# Patient Record
Sex: Male | Born: 1995 | Race: White | Hispanic: No | Marital: Single | Smoking: Never smoker
Health system: Southern US, Community
[De-identification: ages and names within clinical notes are randomized; demographics above are authoritative.]

## PROBLEM LIST (undated history)

## (undated) DIAGNOSIS — D649 Anemia, unspecified: Secondary | ICD-10-CM

## (undated) DIAGNOSIS — N200 Calculus of kidney: Secondary | ICD-10-CM

## (undated) DIAGNOSIS — L309 Dermatitis, unspecified: Secondary | ICD-10-CM

## (undated) DIAGNOSIS — F32A Depression, unspecified: Secondary | ICD-10-CM

## (undated) DIAGNOSIS — F4010 Social phobia, unspecified: Secondary | ICD-10-CM

## (undated) DIAGNOSIS — T7840XA Allergy, unspecified, initial encounter: Secondary | ICD-10-CM

## (undated) DIAGNOSIS — F419 Anxiety disorder, unspecified: Secondary | ICD-10-CM

## (undated) DIAGNOSIS — E739 Lactose intolerance, unspecified: Secondary | ICD-10-CM

## (undated) DIAGNOSIS — F909 Attention-deficit hyperactivity disorder, unspecified type: Secondary | ICD-10-CM

## (undated) DIAGNOSIS — F329 Major depressive disorder, single episode, unspecified: Secondary | ICD-10-CM

## (undated) HISTORY — DX: Allergy, unspecified, initial encounter: T78.40XA

---
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
## 2018-04-30 ENCOUNTER — Other Ambulatory Visit: Payer: Self-pay

## 2018-04-30 ENCOUNTER — Emergency Department (HOSPITAL_COMMUNITY)
Admission: EM | Admit: 2018-04-30 | Discharge: 2018-04-30 | Disposition: A | Discharge status: Home / Self Care | Payer: Self-pay | Attending: Emergency Medicine | Admitting: Emergency Medicine

## 2018-04-30 ENCOUNTER — Emergency Department (HOSPITAL_COMMUNITY): Payer: Self-pay

## 2018-04-30 ENCOUNTER — Encounter (HOSPITAL_COMMUNITY): Payer: Self-pay

## 2018-04-30 DIAGNOSIS — Z79899 Other long term (current) drug therapy: Secondary | ICD-10-CM | POA: Insufficient documentation

## 2018-04-30 DIAGNOSIS — B349 Viral infection, unspecified: Secondary | ICD-10-CM | POA: Insufficient documentation

## 2018-04-30 HISTORY — DX: Social phobia, unspecified: F40.10

## 2018-04-30 HISTORY — DX: Depression, unspecified: F32.A

## 2018-04-30 HISTORY — DX: Attention-deficit hyperactivity disorder, unspecified type: F90.9

## 2018-04-30 HISTORY — DX: Major depressive disorder, single episode, unspecified: F32.9

## 2018-04-30 HISTORY — DX: Dermatitis, unspecified: L30.9

## 2018-04-30 HISTORY — DX: Anemia, unspecified: D64.9

## 2018-04-30 LAB — CBC
HCT: 50.3 % (ref 39.0–52.0)
Hemoglobin: 17.2 g/dL — ABNORMAL HIGH (ref 13.0–17.0)
MCH: 30.6 pg (ref 26.0–34.0)
MCHC: 34.2 g/dL (ref 30.0–36.0)
MCV: 89.3 fL (ref 78.0–100.0)
PLATELETS: 222 10*3/uL (ref 150–400)
RBC: 5.63 MIL/uL (ref 4.22–5.81)
RDW: 13.7 % (ref 11.5–15.5)
WBC: 7.8 10*3/uL (ref 4.0–10.5)

## 2018-04-30 LAB — GROUP A STREP BY PCR: Group A Strep by PCR: NOT DETECTED

## 2018-04-30 LAB — COMPREHENSIVE METABOLIC PANEL
ALK PHOS: 69 U/L (ref 38–126)
ALT: 30 U/L (ref 17–63)
AST: 32 U/L (ref 15–41)
Albumin: 4.9 g/dL (ref 3.5–5.0)
Anion gap: 15 (ref 5–15)
BUN: 8 mg/dL (ref 6–20)
CALCIUM: 9.6 mg/dL (ref 8.9–10.3)
CHLORIDE: 100 mmol/L — AB (ref 101–111)
CO2: 27 mmol/L (ref 22–32)
CREATININE: 0.95 mg/dL (ref 0.61–1.24)
GFR calc Af Amer: >60 mL/min (ref >60)
GFR calc non Af Amer: >60 mL/min (ref >60)
Glucose, Bld: 87 mg/dL (ref 65–99)
Potassium: 3.8 mmol/L (ref 3.5–5.1)
Sodium: 142 mmol/L (ref 135–145)
Total Bilirubin: 0.6 mg/dL (ref 0.3–1.2)
Total Protein: 9 g/dL — ABNORMAL HIGH (ref 6.5–8.1)

## 2018-04-30 LAB — URINALYSIS, ROUTINE W REFLEX MICROSCOPIC
Bilirubin Urine: NEGATIVE
Glucose, UA: NEGATIVE mg/dL
HGB URINE DIPSTICK: NEGATIVE
KETONES UR: 20 mg/dL — AB
Leukocytes, UA: NEGATIVE
Nitrite: NEGATIVE
PROTEIN: NEGATIVE mg/dL
Specific Gravity, Urine: 1.02 (ref 1.005–1.030)
pH: 5 (ref 5.0–8.0)

## 2018-04-30 LAB — MONONUCLEOSIS SCREEN: Mono Screen: NEGATIVE

## 2018-04-30 LAB — I-STAT CG4 LACTIC ACID, ED: Lactic Acid, Venous: 1.95 mmol/L — ABNORMAL HIGH (ref 0.5–1.9)

## 2018-04-30 LAB — INFLUENZA PANEL BY PCR (TYPE A & B)
INFLAPCR: NEGATIVE
Influenza B By PCR: NEGATIVE

## 2018-04-30 LAB — LIPASE, BLOOD: LIPASE: 23 U/L (ref 11–51)

## 2018-04-30 MED ORDER — ONDANSETRON HCL 4 MG/2ML IJ SOLN
4.0000 mg | Freq: Once | INTRAMUSCULAR | Status: AC
  Administered 2018-04-30: 4 mg via INTRAVENOUS
  Filled 2018-04-30: qty 2

## 2018-04-30 MED ORDER — SODIUM CHLORIDE 0.9 % IV BOLUS
1000.0000 mL | Freq: Once | INTRAVENOUS | Status: AC
  Administered 2018-04-30: 1000 mL via INTRAVENOUS

## 2018-04-30 MED ORDER — ONDANSETRON HCL 4 MG PO TABS: 4.0000 mg | ORAL_TABLET | Freq: Three times a day (TID) | ORAL | 0 refills | Status: DC | PRN

## 2018-04-30 MED ORDER — IBUPROFEN 800 MG PO TABS
800.0000 mg | ORAL_TABLET | Freq: Once | ORAL | Status: AC
  Administered 2018-04-30: 800 mg via ORAL
  Filled 2018-04-30: qty 1

## 2018-04-30 MED ORDER — LORAZEPAM 2 MG/ML IJ SOLN
0.5000 mg | Freq: Once | INTRAMUSCULAR | Status: AC
  Administered 2018-04-30: 0.5 mg via INTRAVENOUS
  Filled 2018-04-30: qty 1

## 2018-04-30 MED ORDER — DOXYCYCLINE HYCLATE 100 MG PO CAPS: 100.0000 mg | ORAL_CAPSULE | Freq: Two times a day (BID) | ORAL | 0 refills | Status: DC

## 2018-04-30 NOTE — Discharge Instructions (Addendum)
you were seen in the emergency department and you have a nonspecific viral illness.  I am treating you with doxycycline which covers for tickborne illnesses.  [facility] ending.  You have a negative mono and negative strep test today.  Please follow-up with your primary care physician as soon as possible.  Contact a health care provider if: Your symptoms last for 10 days or longer. Your symptoms get worse over time. You have a fever. You have severe sinus pain in your face or forehead. The glands in your jaw or neck become very swollen. Get help right away if: You feel pain or pressure in your chest. You have shortness of breath. You faint or feel like you will faint. You have severe and persistent vomiting. You feel confused or disoriented.

## 2018-04-30 NOTE — ED Notes (Signed)
Bed: WA02 Expected date:  Expected time:  Means of arrival:  Comments: 

## 2018-04-30 NOTE — ED Provider Notes (Signed)
[facility]-EMERGENCY DEPT Provider Note   CSN: [id number] Arrival date & time: 04/30/18  0947     History   Chief Complaint Chief Complaint  Patient presents with  . Fever  . Back Pain  . Emesis    HPI Norm SaltJason Petties is a 22 y.o. male who presents the emergency department with flulike symptoms.  Patient states that he had onset of low back pain 4 days ago with followed by development of sore throat, fevers, cough and vomiting.  He denies rashes, stiff neck.  He does have a headache but denies photophobia, phonophobia or rash.  He has no recent foreign travel, contacts with similar symptoms or known tick bites.  She has had about 4-5 episodes of vomiting today.  He denies any diarrhea, abdominal pain.  HPI  Past Medical History:  Diagnosis Date  . ADHD   . Allergy   . Anemia   . Depression   . Eczema   . Social anxiety disorder     Patient Active Problem List   Diagnosis Date Noted  . Anemia 02/18/2012  . ADHD (attention deficit hyperactivity disorder), combined type 02/18/2012    History reviewed. No pertinent surgical history.      Home Medications    Prior to Admission medications   Medication Sig Start Date End Date Taking? Authorizing Provider  acetaminophen (TYLENOL) 325 MG tablet Take 650 mg by mouth every 6 (six) hours as needed for moderate pain (back pain).   Yes [provider]  ferrous sulfate (SLOW IRON) 160 (50 FE) MG TBCR SR tablet Take 160 mg by mouth daily.    Yes [provider]  fexofenadine (ALLEGRA) 180 MG tablet Take 180 mg by mouth daily.   Yes [provider]  guaiFENesin (MUCUS RELIEF CHEST CONGESTION) 200 MG tablet Take 200 mg by mouth every 4 (four) hours as needed for cough or to loosen phlegm.   Yes [provider]  ibuprofen (ADVIL,MOTRIN) 200 MG tablet Take 200 mg by mouth every 6 (six) hours as needed for fever or moderate pain.   Yes [provider]    amphetamine-dextroamphetamine (ADDERALL XR) 10 MG 24 hr capsule Take 1 capsule (10 mg total) by mouth daily at 12 noon. 05/19/12 06/18/12  Arfeen, Phillips GroutSyed T, MD  amphetamine-dextroamphetamine (ADDERALL) 10 MG tablet Take 1 tablet (10 mg total) by mouth every morning. 05/19/12 06/18/12  Arfeen, Phillips GroutSyed T, MD  gabapentin (NEURONTIN) 600 MG tablet TAKE 1 TABLET (600 MG TOTAL) BY MOUTH 2 (TWO) TIMES DAILY. Patient not taking: Reported on 04/30/2018 05/05/16   Elvina SidleLauenstein, Kurt, MD  QUEtiapine (SEROQUEL) 100 MG tablet TAKE 1/2 A TABLET BY MOUTH AT BEDTIME Patient not taking: Reported on 04/30/2018 01/15/16   [place] BambergMani, Mario, PA-C  QUEtiapine (SEROQUEL) 100 MG tablet TAKE 1/2 A TABLET BY MOUTH AT BEDTIME Patient not taking: Reported on 04/30/2018 04/16/16   Elvina SidleLauenstein, Kurt, MD    Family History Family History  Problem Relation Age of Onset  . Bipolar disorder Father     Social History Social History   Tobacco Use  . Smoking status: Never Smoker  . Smokeless tobacco: Never Used  Substance Use Topics  . Alcohol use: Never    Alcohol/week: 0.0 oz    Frequency: Never  . Drug use: Never     Allergies   Other   Review of Systems Review of Systems  Ten systems reviewed and are negative for acute change, except as noted in the HPI.   Physical  Exam Updated Vital Signs BP 116/73   Pulse (!) 109   Temp 99.3 F (37.4 C) (Oral)   Resp 15   Ht 5\' 4"  (1.626 m)   Wt 87.5 kg (193 lb)   SpO2 98%   BMI 33.13 kg/m   Physical Exam  Constitutional: He appears well-developed and well-nourished. He appears ill. No distress.  HENT:  Head: Normocephalic and atraumatic.  Eyes: Conjunctivae are normal. No scleral icterus.  Neck: Normal range of motion. Neck supple.  Tonsillar adenopathy.  Full range of motion of the neck without meningismus or nuchal rigidity.  Cardiovascular: Normal rate, regular rhythm and normal heart sounds.  Pulmonary/Chest: Effort normal and breath sounds normal. No respiratory distress.   Abdominal: Soft. There is no tenderness.  Musculoskeletal: He exhibits no edema.  Lymphadenopathy:    He has cervical adenopathy.  Neurological: He is alert.  Skin: Skin is warm and dry. He is not diaphoretic.  Psychiatric: His behavior is normal.  Nursing note and vitals reviewed.    ED Treatments / Results  Labs (all labs ordered are listed, but only abnormal results are displayed) Labs Reviewed  COMPREHENSIVE METABOLIC PANEL - Abnormal; Notable for the following components:      Result Value   Chloride 100 (*)    Total Protein 9.0 (*)    All other components within normal limits  CBC - Abnormal; Notable for the following components:   Hemoglobin 17.2 (*)    All other components within normal limits  I-STAT CG4 LACTIC ACID, ED - Abnormal; Notable for the following components:   Lactic Acid, Venous 1.95 (*)    All other components within normal limits  GROUP A STREP BY PCR  LIPASE, BLOOD  MONONUCLEOSIS SCREEN  URINALYSIS, ROUTINE W REFLEX MICROSCOPIC  ROCKY MTN SPOTTED FVR ABS PNL(IGG+IGM)  INFLUENZA PANEL BY PCR (TYPE A & B)    EKG None  Radiology Dg Chest 2 View  Result Date: 04/30/2018 CLINICAL DATA:  Cough and fever. EXAM: CHEST - 2 VIEW COMPARISON:  PA and lateral chest 02/18/2013. FINDINGS: The lungs are clear. Heart size is normal. No pneumothorax or pleural fluid. No focal bony abnormality. IMPRESSION: Negative chest. Electronically Signed   By: Drusilla Kanner M.D.   On: 04/30/2018 13:53    Procedures Procedures (including critical care time)  Medications Ordered in ED Medications  sodium chloride 0.9 % bolus 1,000 mL (1,000 mLs Intravenous New Bag/Given 04/30/18 1302)  ibuprofen (ADVIL,MOTRIN) tablet 800 mg (800 mg Oral Given 04/30/18 1302)  ondansetron (ZOFRAN) injection 4 mg (4 mg Intravenous Given 04/30/18 1302)  LORazepam (ATIVAN) injection 0.5 mg (0.5 mg Intravenous Given 04/30/18 1303)     Initial Impression / Assessment and Plan / ED Course  I  have reviewed the triage vital signs and the nursing notes.  Pertinent labs & imaging results that were available during my care of the patient were reviewed by me and considered in my medical decision making (see chart for details).  Clinical Course as of Apr 30 1646  Fri Apr 30, 2018  1417 Group A Strep by PCR: NOT DETECTED [AH]  1417 Chloride is likely low from vomiting  Chloride(!): 100 [AH]  1417 Elevated lactic acid secondary to dehydration  Lactic Acid, Venous(!): 1.95 [AH]  1417 Hemoglobin elevated again suggestive of dehydration  Hemoglobin(!): 17.2 [AH]  1418 Elevated proteinemia noted  Total Protein(!): 9.0 [AH]  1418 Mono Screen: NEGATIVE [AH]    Clinical Course User Index [AH] Arthor Captain, PA-C  Patient with nonspecific viral illness.  His viral titers are still pending and I discharge the patient with doxycycline.  The patient will be discharged home.  I discussed return precautions.  I have no suspicion for meningitis as he has excellent range of motion with his neck and his fever has resolved he has no headache.  Chest x-ray is negative however doxycycline should cover for any radiologic atypical pneumonia if present.  Patient appears appropriate for discharge at this time.  Final Clinical Impressions(s) / ED Diagnoses   Final diagnoses:  Nonspecific syndrome suggestive of viral illness    ED Discharge Orders    None       Arthor Captain, PA-C 04/30/18 1648    Donnetta Hutching, MD 05/01/18 [phone]

## 2018-04-30 NOTE — ED Triage Notes (Signed)
Patient c/o mid lower back pain x 6 days and fever x 3 days. Patient states he has been taking Ibuprofen 600 mg every 3-33.5 hours for temperature x 3 days. Patient also reports vomiting since yesterday.

## 2018-05-02 LAB — ROCKY MTN SPOTTED FVR ABS PNL(IGG+IGM)
RMSF IGG: NEGATIVE
RMSF IgM: 0.3 index (ref 0.00–0.89)

## 2018-10-23 ENCOUNTER — Encounter (HOSPITAL_COMMUNITY): Payer: Self-pay | Admitting: Emergency Medicine

## 2018-10-23 ENCOUNTER — Ambulatory Visit (HOSPITAL_COMMUNITY)
Admission: EM | Admit: 2018-10-23 | Discharge: 2018-10-23 | Disposition: A | Discharge status: Home / Self Care | Payer: Self-pay | Attending: Internal Medicine | Admitting: Internal Medicine

## 2018-10-23 ENCOUNTER — Ambulatory Visit (INDEPENDENT_AMBULATORY_CARE_PROVIDER_SITE_OTHER): Payer: Self-pay

## 2018-10-23 DIAGNOSIS — N200 Calculus of kidney: Secondary | ICD-10-CM

## 2018-10-23 DIAGNOSIS — R109 Unspecified abdominal pain: Secondary | ICD-10-CM | POA: Insufficient documentation

## 2018-10-23 LAB — POCT URINALYSIS DIP (DEVICE)
Bilirubin Urine: NEGATIVE
GLUCOSE, UA: NEGATIVE mg/dL
Ketones, ur: NEGATIVE mg/dL
LEUKOCYTES UA: NEGATIVE
NITRITE: NEGATIVE
Protein, ur: NEGATIVE mg/dL
Specific Gravity, Urine: 1.02 (ref 1.005–1.030)
Urobilinogen, UA: 0.2 mg/dL (ref 0.0–1.0)
pH: 7 (ref 5.0–8.0)

## 2018-10-23 MED ORDER — TAMSULOSIN HCL 0.4 MG PO CAPS: ORAL_CAPSULE | ORAL | 0 refills | Status: DC

## 2018-10-23 MED ORDER — KETOROLAC TROMETHAMINE 60 MG/2ML IM SOLN
60.0000 mg | Freq: Once | INTRAMUSCULAR | Status: AC
  Administered 2018-10-23: 60 mg via INTRAMUSCULAR

## 2018-10-23 MED ORDER — TRAMADOL HCL 50 MG PO TABS: ORAL_TABLET | ORAL | 0 refills | Status: DC

## 2018-10-23 MED ORDER — KETOROLAC TROMETHAMINE 60 MG/2ML IM SOLN
INTRAMUSCULAR | Status: AC
  Filled 2018-10-23: qty 2

## 2018-10-23 NOTE — ED Provider Notes (Addendum)
[facility]    CSN: [id number] Arrival date & time: 10/23/18  1410     History   Chief Complaint Chief Complaint  Patient presents with  . Flank Pain    HPI Tanner Sharp is a 22 y.o. male.   Who presents with onset of R flank pain x 1 weeks, since the 2nd day has been getting worse. Pain comes in waves. On day 3 felt mild discomfort to void. Has had frequency. Has been getting sweats with severe pain when his dog who sleeps with him and pushes himself to get off the bed. States that his mother has hx of renal stones.     Past Medical History:  Diagnosis Date  . ADHD   . Allergy   . Anemia   . Depression   . Eczema   . Social anxiety disorder     Patient Active Problem List   Diagnosis Date Noted  . Anemia 02/18/2012  . ADHD (attention deficit hyperactivity disorder), combined type 02/18/2012    History reviewed. No pertinent surgical history.   Home Medications    Prior to Admission medications   Medication Sig Start Date End Date Taking? Authorizing Provider  acetaminophen (TYLENOL) 325 MG tablet Take 650 mg by mouth every 6 (six) hours as needed for moderate pain (back pain).    [provider]  ferrous sulfate (SLOW IRON) 160 (50 FE) MG TBCR SR tablet Take 160 mg by mouth daily.     [provider]  fexofenadine (ALLEGRA) 180 MG tablet Take 180 mg by mouth daily.    [provider]  guaiFENesin (MUCUS RELIEF CHEST CONGESTION) 200 MG tablet Take 200 mg by mouth every 4 (four) hours as needed for cough or to loosen phlegm.    [provider]  ibuprofen (ADVIL,MOTRIN) 200 MG tablet Take 200 mg by mouth every 6 (six) hours as needed for fever or moderate pain.    [provider]  ondansetron (ZOFRAN) 4 MG tablet Take 1 tablet (4 mg total) by mouth every 8 (eight) hours as needed for nausea or vomiting. 04/30/18   Arthor CaptainHarris, Abigail, PA-C  tamsulosin [facility](FLOMAX) 0.4 MG CAPS capsule One qd to help pass stone 10/23/18    Rodriguez-Southworth, Nettie ElmSylvia, PA-C  traMADol (ULTRAM) 50 MG tablet 1-2 q 6h prn pain 10/23/18   Rodriguez-Southworth, Nettie ElmSylvia, PA-C    Family History Family History  Problem Relation Age of Onset  . Bipolar disorder Father     Social History Social History   Tobacco Use  . Smoking status: Never Smoker  . Smokeless tobacco: Never Used  Substance Use Topics  . Alcohol use: Never    Alcohol/week: 0.0 standard drinks    Frequency: Never  . Drug use: Never     Allergies   Other   Review of Systems Review of Systems  Constitutional: Negative for chills, diaphoresis and fever.  Gastrointestinal: Negative for abdominal distention, abdominal pain, nausea and vomiting.  Genitourinary: Negative for difficulty urinating, dysuria, frequency, hematuria and urgency.  Musculoskeletal: Positive for back pain.  Skin: Negative for rash.  Neurological: Negative for numbness.     Physical Exam Triage Vital Signs ED Triage Vitals [10/23/18 1608]  Enc Vitals Group     BP 121/71     Pulse Rate 67     Resp 16     Temp 98.2 F (36.8 C)     Temp src      SpO2 100 %     Weight  Height      Head Circumference      Peak Flow      Pain Score 8     Pain Loc      Pain Edu?      Excl. in GC?    No data found.  Updated Vital Signs BP 121/71   Pulse 67   Temp 98.2 F (36.8 C)   Resp 16   SpO2 100%   Visual Acuity Right Eye Distance:   Left Eye Distance:   Bilateral Distance:    Right Eye Near:   Left Eye Near:    Bilateral Near:     Physical Exam  Constitutional: He is oriented to person, place, and time. He appears well-developed and well-nourished. He appears distressed.  In moderate pain off and on  HENT:  Head: Normocephalic.  Right Ear: External ear normal.  Left Ear: External ear normal.  Nose: Nose normal.  Eyes: Conjunctivae are normal. No scleral icterus.  Neck: Neck supple.  Pulmonary/Chest: Effort normal.  Abdominal: Soft. Bowel sounds are normal. He  exhibits no distension and no mass. There is tenderness. There is no rebound and no guarding.  Has moderate tenderness on R mid abd only. No CVA tenderness.   Musculoskeletal: Normal range of motion.  BACK-Has mild  Pain( 2/5) on R flank with light touch and this pain is provoked with R lateral spine motion. No rash on this are. Pain with deep palpation on same area is 5/10. Neg SLR  Neurological: He is alert and oriented to person, place, and time. He displays normal reflexes.  Skin: Skin is warm and dry. He is not diaphoretic.  Psychiatric: He has a normal mood and affect. His behavior is normal. Judgment and thought content normal.  Nursing note and vitals reviewed.  UC Treatments / Results  Labs (all labs ordered are listed, but only abnormal results are displayed) Labs Reviewed  POCT URINALYSIS DIP (DEVICE) - Abnormal; Notable for the following components:      Result Value   Hgb urine dipstick TRACE (*)    All other components within normal limits  URINE CULTURE    EKG None  Radiology Dg Abd 1 View  Result Date: 10/23/2018 CLINICAL DATA:  Right flank pain for 5 days.  Hematuria. EXAM: ABDOMEN - 1 VIEW COMPARISON:  None. FINDINGS: The bowel gas pattern is normal. A 4 mm radiodensity is seen in the right upper quadrant, which is suspicious for calculus in the region of the right renal pelvis or UPJ. 2 mm radiodensity overlies the upper pole of the left kidney, suspicious for a tiny left renal calculus. IMPRESSION: 4 mm radiodensity in region of right renal pelvis or UPJ, suspicious for urinary calculus. Suspect tiny left upper pole renal calculus. Electronically Signed   By: Myles Rosenthal M.D.   On: 10/23/2018 17:09   Medications Ordered in UC Medications  ketorolac (TORADOL) injection 60 mg (60 mg Intramuscular Given 10/23/18 1736)    Initial Impression / Assessment and Plan / UC Course  I have reviewed the triage vital signs and the nursing notes.  Pertinent labs & imaging  results that were available during my care of the patient were reviewed by me and considered in my medical decision making (see chart for details). I placed him on Flomax 0. 4 qd, Tramadol, and given a strainer to catch if he passes a stone. If he gets worse, needs to go to ER Otherwise needs to FU with PCP next week.  Final Clinical Impressions(s) / UC Diagnoses   Final diagnoses:  Nephrolithiasis     Discharge Instructions     Drink lots of water to help pass the stone. A Cat scan is the best test to confirm the location of the stone, for now we only have access to plain xray. If you get worse, go to ER  Use the strainer every time to void, and save the stone to have it analyzed.  Otherwise, follow up with a urologist next week.    Your abdominal xray shows the following:  IMPRESSION: 4 mm radiodensity in region of right renal pelvis or UPJ, suspicious for urinary calculus.   Suspect tiny left upper pole renal calculus.        ED Prescriptions    Medication Sig Dispense Auth. Provider   tamsulosin (FLOMAX) 0.4 MG CAPS capsule One qd to help pass stone 10 capsule Rodriguez-Southworth, Nettie Elm, PA-C   traMADol (ULTRAM) 50 MG tablet 1-2 q 6h prn pain 20 tablet Rodriguez-Southworth, Nettie Elm, PA-C     Controlled Substance Prescriptions [place] Controlled Substance Registry consulted? no   Garey Ham, PA-C 10/23/18 2019    Rodriguez-Southworth, [place], PA-C 10/23/18 2019

## 2018-10-23 NOTE — ED Triage Notes (Signed)
Pt c/o R flank pain x5 days

## 2018-10-23 NOTE — Discharge Instructions (Addendum)
Drink lots of water to help pass the stone. A Cat scan is the best test to confirm the location of the stone, for now we only have access to plain xray. If you get worse, go to ER  Use the strainer every time to void, and save the stone to have it analyzed.  Otherwise, follow up with a urologist next week.    Your abdominal xray shows the following:  IMPRESSION: 4 mm radiodensity in region of right renal pelvis or UPJ, suspicious for urinary calculus.   Suspect tiny left upper pole renal calculus.

## 2018-10-24 LAB — URINE CULTURE
Culture: NO GROWTH
SPECIAL REQUESTS: NORMAL

## 2018-11-18 ENCOUNTER — Other Ambulatory Visit: Payer: Self-pay | Admitting: Urology

## 2018-11-24 NOTE — Patient Instructions (Addendum)
Lenville Rinke  11/24/2018   Your procedure is scheduled on: 11-29-2018    Report to [facility] Main  Entrance     Report to admitting at 8:30AM    Call this number if you have problems the morning of surgery [phone]      Remember: Do not eat food or drink liquids :After Midnight. BRUSH YOUR TEETH MORNING OF SURGERY AND RINSE YOUR MOUTH OUT, NO CHEWING GUM CANDY OR MINTS.     Take these medicines the morning of surgery with A SIP OF WATER: allegra if needed, tylenol if needed, Tamsulosin, tramadol if needed                                  You may not have any metal on your body including hair pins and              piercings  Do not wear jewelry, make-up, lotions, powders or perfumes, deodorant                          Men may shave face and neck.   Do not bring valuables to the hospital. [facility] IS NOT             RESPONSIBLE   FOR VALUABLES.  Contacts, dentures or bridgework may not be worn into surgery.       Patients discharged the day of surgery will not be allowed to drive home. IF YOU ARE HAVING SURGERY AND GOING HOME THE SAME DAY, YOU MUST HAVE AN ADULT TO DRIVE YOU HOME AND BE WITH YOU FOR 24 HOURS. YOU MAY GO HOME BY TAXI OR UBER OR ORTHERWISE, BUT AN ADULT MUST ACCOMPANY YOU HOME AND STAY WITH YOU FOR 24 HOURS.  Name and phone number of your driver:  Special Instructions: N/A              Please read over the following fact sheets you were given: _____________________________________________________________________             [facility] - Preparing for Surgery Before surgery, you can play an important role.  Because skin is not sterile, your skin needs to be as free of germs as possible.  You can reduce the number of germs on your skin by washing with CHG (chlorahexidine gluconate) soap before surgery.  CHG is an antiseptic cleaner which kills germs and bonds with the skin to continue killing germs even after washing. Please DO  NOT use if you have an allergy to CHG or antibacterial soaps.  If your skin becomes reddened/irritated stop using the CHG and inform your nurse when you arrive at Short Stay. Do not shave (including legs and underarms) for at least 48 hours prior to the first CHG shower.  You may shave your face/neck. Please follow these instructions carefully:  1.  Shower with CHG Soap the night before surgery and the  morning of Surgery.  2.  If you choose to wash your hair, wash your hair first as usual with your  normal  shampoo.  3.  After you shampoo, rinse your hair and body thoroughly to remove the  shampoo.                           4.  Use CHG as you would any other liquid soap.  You can apply chg directly  to the skin and wash                       Gently with a scrungie or clean washcloth.  5.  Apply the CHG Soap to your body ONLY FROM THE NECK DOWN.   Do not use on face/ open                           Wound or open sores. Avoid contact with eyes, ears mouth and genitals (private parts).                       Wash face,  Genitals (private parts) with your normal soap.             6.  Wash thoroughly, paying special attention to the area where your surgery  will be performed.  7.  Thoroughly rinse your body with warm water from the neck down.  8.  DO NOT shower/wash with your normal soap after using and rinsing off  the CHG Soap.                9.  Pat yourself dry with a clean towel.            10.  Wear clean pajamas.            11.  Place clean sheets on your bed the night of your first shower and do not  sleep with pets. Day of Surgery : Do not apply any lotions/deodorants the morning of surgery.  Please wear clean clothes to the hospital/surgery center.  FAILURE TO FOLLOW THESE INSTRUCTIONS MAY RESULT IN THE CANCELLATION OF YOUR SURGERY PATIENT SIGNATURE_________________________________  NURSE  SIGNATURE__________________________________  ________________________________________________________________________

## 2018-11-25 ENCOUNTER — Encounter (HOSPITAL_COMMUNITY)
Admission: RE | Admit: 2018-11-25 | Discharge: 2018-11-25 | Disposition: A | Discharge status: Home / Self Care | Payer: Self-pay | Source: Ambulatory Visit | Attending: Urology | Admitting: Urology

## 2018-11-25 ENCOUNTER — Encounter (HOSPITAL_COMMUNITY): Payer: Self-pay

## 2018-11-25 ENCOUNTER — Other Ambulatory Visit: Payer: Self-pay

## 2018-11-25 DIAGNOSIS — Z01812 Encounter for preprocedural laboratory examination: Secondary | ICD-10-CM | POA: Insufficient documentation

## 2018-11-25 DIAGNOSIS — N201 Calculus of ureter: Secondary | ICD-10-CM | POA: Insufficient documentation

## 2018-11-25 HISTORY — DX: Calculus of kidney: N20.0

## 2018-11-25 HISTORY — DX: Lactose intolerance, unspecified: E73.9

## 2018-11-25 LAB — BASIC METABOLIC PANEL
Anion gap: 8 (ref 5–15)
BUN: 12 mg/dL (ref 6–20)
CO2: 27 mmol/L (ref 22–32)
Calcium: 9.3 mg/dL (ref 8.9–10.3)
Chloride: 104 mmol/L (ref 98–111)
Creatinine, Ser: 1.03 mg/dL (ref 0.61–1.24)
GFR calc Af Amer: >60 mL/min (ref >60)
GFR calc non Af Amer: >60 mL/min (ref >60)
Glucose, Bld: 102 mg/dL — ABNORMAL HIGH (ref 70–99)
Potassium: 3.9 mmol/L (ref 3.5–5.1)
Sodium: 139 mmol/L (ref 135–145)

## 2018-11-25 LAB — CBC
HCT: 45.4 % (ref 39.0–52.0)
Hemoglobin: 14.9 g/dL (ref 13.0–17.0)
MCH: 29.9 pg (ref 26.0–34.0)
MCHC: 32.8 g/dL (ref 30.0–36.0)
MCV: 91 fL (ref 80.0–100.0)
NRBC: 0 % (ref 0.0–0.2)
PLATELETS: 235 10*3/uL (ref 150–400)
RBC: 4.99 MIL/uL (ref 4.22–5.81)
RDW: 13.1 % (ref 11.5–15.5)
WBC: 7.1 10*3/uL (ref 4.0–10.5)

## 2018-11-28 NOTE — Anesthesia Preprocedure Evaluation (Addendum)
Anesthesia Evaluation  Patient identified by MRN, date of birth, ID band Patient awake    Reviewed: Allergy & Precautions, NPO status , Patient's Chart, lab work & pertinent test results  Airway Mallampati: II  TM Distance: <3 FB Neck ROM: Full    Dental no notable dental hx. (+) Teeth Intact, Dental Advisory Given   Pulmonary neg pulmonary ROS,    Pulmonary exam normal breath sounds clear to auscultation       Cardiovascular Exercise Tolerance: Good negative cardio ROS Normal cardiovascular exam Rhythm:Regular Rate:Normal     Neuro/Psych PSYCHIATRIC DISORDERS Anxiety ADHDnegative neurological ROS     GI/Hepatic negative GI ROS, Neg liver ROS,   Endo/Other  negative endocrine ROS  Renal/GU Renal disease     Musculoskeletal negative musculoskeletal ROS (+)   Abdominal (+) + obese,   Peds  Hematology   Anesthesia Other Findings   Reproductive/Obstetrics                            Anesthesia Physical Anesthesia Plan  ASA: II  Anesthesia Plan: General   Post-op Pain Management:    Induction: Intravenous  PONV Risk Score and Plan: 2 and Treatment may vary due to age or medical condition, Ondansetron and Dexamethasone  Airway Management Planned: LMA  Additional Equipment:   Intra-op Plan:   Post-operative Plan:   Informed Consent: I have reviewed the patients History and Physical, chart, labs and discussed the procedure including the risks, benefits and alternatives for the proposed anesthesia with the patient or authorized representative who has indicated his/her understanding and acceptance.   Dental advisory given  Plan Discussed with: CRNA  Anesthesia Plan Comments:        Anesthesia Quick Evaluation

## 2018-11-29 ENCOUNTER — Encounter (HOSPITAL_COMMUNITY): Payer: Self-pay | Admitting: *Deleted

## 2018-11-29 ENCOUNTER — Ambulatory Visit (HOSPITAL_COMMUNITY): Payer: Medicaid Other | Admitting: Anesthesiology

## 2018-11-29 ENCOUNTER — Ambulatory Visit (HOSPITAL_COMMUNITY): Payer: Medicaid Other | Admitting: Physician Assistant

## 2018-11-29 ENCOUNTER — Ambulatory Visit (HOSPITAL_COMMUNITY)
Admission: RE | Admit: 2018-11-29 | Discharge: 2018-11-29 | Disposition: A | Discharge status: Home / Self Care | Payer: Medicaid Other | Source: Ambulatory Visit | Attending: Urology | Admitting: Urology

## 2018-11-29 ENCOUNTER — Encounter (HOSPITAL_COMMUNITY)
Admission: RE | Disposition: A | Discharge status: Home / Self Care | Payer: Self-pay | Source: Ambulatory Visit | Attending: Urology

## 2018-11-29 ENCOUNTER — Ambulatory Visit (HOSPITAL_COMMUNITY): Payer: Medicaid Other

## 2018-11-29 DIAGNOSIS — Z79899 Other long term (current) drug therapy: Secondary | ICD-10-CM | POA: Diagnosis not present

## 2018-11-29 DIAGNOSIS — N200 Calculus of kidney: Secondary | ICD-10-CM | POA: Diagnosis present

## 2018-11-29 DIAGNOSIS — N132 Hydronephrosis with renal and ureteral calculous obstruction: Secondary | ICD-10-CM | POA: Diagnosis not present

## 2018-11-29 DIAGNOSIS — Z91048 Other nonmedicinal substance allergy status: Secondary | ICD-10-CM | POA: Diagnosis not present

## 2018-11-29 DIAGNOSIS — Z91018 Allergy to other foods: Secondary | ICD-10-CM | POA: Insufficient documentation

## 2018-11-29 HISTORY — PX: CYSTOSCOPY/RETROGRADE/URETEROSCOPY/STONE EXTRACTION WITH BASKET: SHX5317

## 2018-11-29 HISTORY — PX: CYSTOSCOPY WITH STENT PLACEMENT: SHX5790

## 2018-11-29 HISTORY — PX: HOLMIUM LASER APPLICATION: SHX5852

## 2018-11-29 SURGERY — CYSTOSCOPY, WITH CALCULUS REMOVAL USING BASKET
Anesthesia: General | Laterality: Right

## 2018-11-29 MED ORDER — FENTANYL CITRATE (PF) 100 MCG/2ML IJ SOLN
INTRAMUSCULAR | Status: DC | PRN
  Administered 2018-11-29 (×2): 50 ug via INTRAVENOUS

## 2018-11-29 MED ORDER — ONDANSETRON HCL 4 MG/2ML IJ SOLN
4.0000 mg | Freq: Once | INTRAMUSCULAR | Status: AC
  Administered 2018-11-29: 4 mg via INTRAVENOUS

## 2018-11-29 MED ORDER — ONDANSETRON HCL 4 MG/2ML IJ SOLN: 4.0000 mg | Freq: Once | INTRAMUSCULAR | Status: DC | PRN

## 2018-11-29 MED ORDER — MIDAZOLAM HCL 2 MG/2ML IJ SOLN
INTRAMUSCULAR | Status: AC
  Filled 2018-11-29: qty 2

## 2018-11-29 MED ORDER — MEPERIDINE HCL 50 MG/ML IJ SOLN: 6.2500 mg | INTRAMUSCULAR | Status: DC | PRN

## 2018-11-29 MED ORDER — ONDANSETRON HCL 4 MG/2ML IJ SOLN
INTRAMUSCULAR | Status: AC
  Filled 2018-11-29: qty 2

## 2018-11-29 MED ORDER — IOHEXOL 300 MG/ML  SOLN
INTRAMUSCULAR | Status: DC | PRN
  Administered 2018-11-29: 7 mL via URETHRAL

## 2018-11-29 MED ORDER — SODIUM CHLORIDE 0.9 % IR SOLN
Status: DC | PRN
  Administered 2018-11-29: 6000 mL via INTRAVESICAL

## 2018-11-29 MED ORDER — HYDROCODONE-ACETAMINOPHEN 7.5-325 MG PO TABS: 1.0000 | ORAL_TABLET | Freq: Once | ORAL | Status: DC | PRN

## 2018-11-29 MED ORDER — LIDOCAINE 2% (20 MG/ML) 5 ML SYRINGE
INTRAMUSCULAR | Status: AC
  Filled 2018-11-29: qty 5

## 2018-11-29 MED ORDER — HYDROMORPHONE HCL 1 MG/ML IJ SOLN: 0.2500 mg | INTRAMUSCULAR | Status: DC | PRN

## 2018-11-29 MED ORDER — PROPOFOL 10 MG/ML IV BOLUS
INTRAVENOUS | Status: DC | PRN
  Administered 2018-11-29: 200 mg via INTRAVENOUS

## 2018-11-29 MED ORDER — DEXAMETHASONE SODIUM PHOSPHATE 10 MG/ML IJ SOLN
INTRAMUSCULAR | Status: DC | PRN
  Administered 2018-11-29: 10 mg via INTRAVENOUS

## 2018-11-29 MED ORDER — HYDROCODONE-ACETAMINOPHEN 7.5-325 MG PO TABS
ORAL_TABLET | ORAL | Status: AC
  Filled 2018-11-29: qty 1

## 2018-11-29 MED ORDER — EPHEDRINE SULFATE-NACL 50-0.9 MG/10ML-% IV SOSY
PREFILLED_SYRINGE | INTRAVENOUS | Status: DC | PRN
  Administered 2018-11-29 (×2): 10 mg via INTRAVENOUS

## 2018-11-29 MED ORDER — KETOROLAC TROMETHAMINE 30 MG/ML IJ SOLN
INTRAMUSCULAR | Status: AC
  Administered 2018-11-29: 30 mg via INTRAVENOUS
  Filled 2018-11-29: qty 1

## 2018-11-29 MED ORDER — KETOROLAC TROMETHAMINE 30 MG/ML IJ SOLN
30.0000 mg | Freq: Once | INTRAMUSCULAR | Status: AC | PRN
  Administered 2018-11-29: 30 mg via INTRAVENOUS

## 2018-11-29 MED ORDER — LIDOCAINE 2% (20 MG/ML) 5 ML SYRINGE
INTRAMUSCULAR | Status: DC | PRN
  Administered 2018-11-29: 100 mg via INTRAVENOUS

## 2018-11-29 MED ORDER — LACTATED RINGERS IV SOLN
INTRAVENOUS | Status: DC
  Administered 2018-11-29 (×2): via INTRAVENOUS

## 2018-11-29 MED ORDER — HYDROCODONE-ACETAMINOPHEN 5-325 MG PO TABS: 2.0000 | ORAL_TABLET | Freq: Four times a day (QID) | ORAL | 0 refills | Status: DC | PRN

## 2018-11-29 MED ORDER — ONDANSETRON HCL 4 MG/2ML IJ SOLN
INTRAMUSCULAR | Status: DC | PRN
  Administered 2018-11-29: 4 mg via INTRAVENOUS

## 2018-11-29 MED ORDER — MIDAZOLAM HCL 5 MG/5ML IJ SOLN
INTRAMUSCULAR | Status: DC | PRN
  Administered 2018-11-29: 2 mg via INTRAVENOUS

## 2018-11-29 MED ORDER — PROPOFOL 10 MG/ML IV BOLUS
INTRAVENOUS | Status: AC
  Filled 2018-11-29: qty 20

## 2018-11-29 MED ORDER — CEPHALEXIN 500 MG PO CAPS: 500.0000 mg | ORAL_CAPSULE | Freq: Once | ORAL | 0 refills | Status: AC

## 2018-11-29 MED ORDER — CEFAZOLIN SODIUM-DEXTROSE 2-4 GM/100ML-% IV SOLN
2.0000 g | INTRAVENOUS | Status: AC
  Administered 2018-11-29: 2 g via INTRAVENOUS
  Filled 2018-11-29: qty 100

## 2018-11-29 MED ORDER — ACETAMINOPHEN 500 MG PO TABS
1000.0000 mg | ORAL_TABLET | Freq: Once | ORAL | Status: AC
  Administered 2018-11-29: 1000 mg via ORAL
  Filled 2018-11-29: qty 2

## 2018-11-29 MED ORDER — FENTANYL CITRATE (PF) 100 MCG/2ML IJ SOLN
INTRAMUSCULAR | Status: AC
  Filled 2018-11-29: qty 2

## 2018-11-29 SURGICAL SUPPLY — 29 items
BAG URO CATCHER STRL LF (MISCELLANEOUS) ×3 IMPLANT
BASKET ZERO TIP NITINOL 2.4FR (BASKET) ×2 IMPLANT
BSKT STON RTRVL ZERO TP 2.4FR (BASKET) ×1
CATH INTERMIT  6FR 70CM (CATHETERS) ×3 IMPLANT
CLOTH BEACON ORANGE TIMEOUT ST (SAFETY) ×1 IMPLANT
COVER SURGICAL LIGHT HANDLE (MISCELLANEOUS) ×1 IMPLANT
COVER WAND RF STERILE (DRAPES) IMPLANT
EXTRACTOR STONE NITINOL NGAGE (UROLOGICAL SUPPLIES) IMPLANT
FIBER LASER FLEXIVA 1000 (UROLOGICAL SUPPLIES) IMPLANT
FIBER LASER FLEXIVA 365 (UROLOGICAL SUPPLIES) IMPLANT
FIBER LASER FLEXIVA 550 (UROLOGICAL SUPPLIES) IMPLANT
FIBER LASER TRAC TIP (UROLOGICAL SUPPLIES) ×2 IMPLANT
GLOVE BIO SURGEON STRL SZ8 (GLOVE) ×1 IMPLANT
GLOVE BIOGEL M 8.0 STRL (GLOVE) ×3 IMPLANT
GLOVE BIOGEL M STRL SZ7.5 (GLOVE) ×2 IMPLANT
GOWN STRL REUS W/TWL LRG LVL3 (GOWN DISPOSABLE) ×6 IMPLANT
GOWN STRL REUS W/TWL XL LVL3 (GOWN DISPOSABLE) ×5 IMPLANT
GUIDEWIRE ANG ZIPWIRE 038X150 (WIRE) ×3 IMPLANT
GUIDEWIRE STR DUAL SENSOR (WIRE) ×3 IMPLANT
IV NS 1000ML (IV SOLUTION)
IV NS 1000ML BAXH (IV SOLUTION) ×1 IMPLANT
MANIFOLD NEPTUNE II (INSTRUMENTS) ×3 IMPLANT
PACK CYSTO (CUSTOM PROCEDURE TRAY) ×3 IMPLANT
SHEATH URETERAL 12FRX35CM (MISCELLANEOUS) ×2 IMPLANT
STENT URET 6FRX24 CONTOUR (STENTS) ×2 IMPLANT
STENT URET 6FRX26 CONTOUR (STENTS) IMPLANT
TUBE FEEDING 8FR 16IN STR KANG (MISCELLANEOUS) IMPLANT
TUBING CONNECTING 10 (TUBING) ×2 IMPLANT
TUBING CONNECTING 10' (TUBING) ×1

## 2018-11-29 NOTE — Anesthesia Procedure Notes (Signed)
Procedure Name: LMA Insertion Date/Time: 11/29/2018 10:50 AM Performed by: Wynonia SoursWalker, Zac Torti L, CRNA Pre-anesthesia Checklist: Patient identified, Emergency Drugs available, Suction available, Patient being monitored and Timeout performed Patient Re-evaluated:Patient Re-evaluated prior to induction Oxygen Delivery Method: Circle system utilized Preoxygenation: Pre-oxygenation with 100% oxygen Induction Type: IV induction LMA: LMA inserted LMA Size: 4.0 Number of attempts: 1 Placement Confirmation: positive ETCO2 and breath sounds checked- equal and bilateral Tube secured with: Tape Dental Injury: Teeth and Oropharynx as per pre-operative assessment

## 2018-11-29 NOTE — H&P (Signed)
Urology H+P Note   Attending: Wilkie Aye, MD  Chief Complaint: kidney stone  HPI: Tanner Sharp is a 23 y.o. male with right sided nephrolithiasis, 58mm at the right UVJ as well as two additional non-obstructing renal calculi measuring 66mm. He presents today for elective right USE.   Past Medical History: Past Medical History:  Diagnosis Date  . ADHD   . Allergy   . Anemia   . Depression   . Eczema   . Kidney stones   . Lactose intolerance   . Social anxiety disorder     Past Surgical History:  History reviewed. No pertinent surgical history.  Medication: Current Facility-Administered Medications  Medication Dose Route Frequency Provider Last Rate Last Dose  . ceFAZolin (ANCEF) IVPB 2g/100 mL premix  2 g Intravenous 30 min Pre-Op Ronne Binning Mardene Celeste, MD      . lactated ringers infusion   Intravenous Continuous Trevor Iha, MD 75 mL/hr at 11/29/18 0910    . ondansetron (ZOFRAN) 4 MG/2ML injection             Allergies: Allergies  Allergen Reactions  . Pollen Extract Swelling    Itchy, eyes swell, rash   . Other     Dairy and nuts and  Cat Dander (Swelling Eyes, Throat, SOB)    Social History: Social History   Tobacco Use  . Smoking status: Never Smoker  . Smokeless tobacco: Never Used  Substance Use Topics  . Alcohol use: Never    Alcohol/week: 0.0 standard drinks    Frequency: Never  . Drug use: Never    Family History Family History  Problem Relation Age of Onset  . Bipolar disorder Father     Review of Systems 10 systems were reviewed and are negative except as noted specifically in the HPI.  Objective   Vital signs in last 24 hours: BP 126/78   Pulse 71   Temp 98 F (36.7 C) (Oral)   Resp 16   Ht 5\' 4"  (1.626 m)   Wt 83 kg   SpO2 98%   BMI 31.41 kg/m   Physical Exam General: NAD, A&O, resting, appropriate HEENT: [place]/AT, EOMI, MMM Pulmonary: Normal work of breathing Cardiovascular: HDS, adequate peripheral  perfusion Abdomen: Soft, NTTP, nondistended. GU: No CVA tenderness Extremities: warm and well perfused Neuro: Appropriate, no focal neurological deficits  Most Recent Labs: Lab Results  Component Value Date   WBC 7.1 11/25/2018   HGB 14.9 11/25/2018   HCT 45.4 11/25/2018   PLT 235 11/25/2018    Lab Results  Component Value Date   NA 139 11/25/2018   K 3.9 11/25/2018   CL 104 11/25/2018   CO2 27 11/25/2018   BUN 12 11/25/2018   CREATININE 1.03 11/25/2018   CALCIUM 9.3 11/25/2018    No results found for: INR, APTT   IMAGING: No results found.  ------  Assessment:  23 y.o. male with nephrolithiasis here today for right USE.   Plan: - Proceed to OR as planned

## 2018-11-29 NOTE — Transfer of Care (Signed)
Immediate Anesthesia Transfer of Care Note  Patient: Tanner SaltJason Sharp  Procedure(s) Performed: CYSTOSCOPY/RETROGRADE/URETEROSCOPY/STONE EXTRACTION WITH BASKET (Right ) CYSTOSCOPY WITH STENT PLACEMENT (Right ) HOLMIUM LASER APPLICATION (Right )  Patient Location: PACU  Anesthesia Type:General  Level of Consciousness: sedated  Airway & Oxygen Therapy: Patient Spontanous Breathing and Patient connected to face mask oxygen  Post-op Assessment: Report given to RN and Post -op Vital signs reviewed and stable  Post vital signs: Reviewed and stable  Last Vitals:  Vitals Value Taken Time  BP    Temp    Pulse 55 11/29/2018 12:01 PM  Resp 20 11/29/2018 12:01 PM  SpO2 100 % 11/29/2018 12:01 PM  Vitals shown include unvalidated device data.  Last Pain:  Vitals:   11/29/18 0841  TempSrc: Oral  PainSc: 6       Patients Stated Pain Goal: 4 (11/29/18 0841)  Complications: No apparent anesthesia complications

## 2018-11-29 NOTE — Anesthesia Postprocedure Evaluation (Signed)
Anesthesia Post Note  Patient: Tanner Sharp  Procedure(s) Performed: CYSTOSCOPY/RETROGRADE/URETEROSCOPY/STONE EXTRACTION WITH BASKET (Right ) CYSTOSCOPY WITH STENT PLACEMENT (Right ) HOLMIUM LASER APPLICATION (Right )     Patient location during evaluation: PACU Anesthesia Type: General Level of consciousness: awake and alert Pain management: pain level controlled Vital Signs Assessment: post-procedure vital signs reviewed and stable Respiratory status: spontaneous breathing, nonlabored ventilation, respiratory function stable and patient connected to nasal cannula oxygen Cardiovascular status: blood pressure returned to baseline and stable Postop Assessment: no apparent nausea or vomiting Anesthetic complications: no    Last Vitals:  Vitals:   11/29/18 1257 11/29/18 1300  BP: (!) 143/93 (!) 125/93  Pulse:  61  Resp:    Temp:  (!) 36.4 C  SpO2:  100%    Last Pain:  Vitals:   11/29/18 1300  TempSrc:   PainSc: 4                  Trevor Iha

## 2018-11-29 NOTE — Op Note (Signed)
Preoperative Diagnosis: Right nephrolithiasis  Postoperative Diagnosis:  Same  Procedure(s) Performed:   - Cystourethroscopy - Right ureteroscopic stone extraction with laser lithotripsy - Right retrograde pyelogram - Right ureteral stent placement - Intraoperative fluoroscopy with interpretation <1hr.   Teaching Surgeon:  Wilkie Aye, MD  Resident Surgeon:  Chrishawna Farina Geri [place]in[place], MD  Assistant(s):  None  Anesthesia:  General  Fluids:  See anesthesia record  Estimated blood loss:  0cc  Specimens:  Stone for analysis  Drains:  Right 6Fr x 24cm JJ ureteral stent with dangler  Complications:  None  Indications: 23 y.o. patient with nephrolithiasis presenting for right USE. Risks & benefits of the procedure discussed with the patient, who wishes to proceed.  Findings:  1.) Normal cystourethroscopy without lesions, masses or stones 2.) ~17mm right ureteral stone in mid right ureter, successfully fragmented and basket extracted 3.) Three ~64mm adherent right renal calculi, successfully basket extracted 4.) Successful placement of right ureteral stent, 6Fr x 24cm JJ with dangler  Radiologic Interpretation of Retrograde Pyelogram: Right retrograde pyelogram demonstrated mild right hydronephrosis with evidence of stone in the mid right ureter  Description:  The patient was correctly identified in the preop holding area where written informed consent as well potential risk and complication reviewed. The patient agreed. They were brought back to the operative suite where a preinduction timeout was performed. Once correct information was verified, general anesthesia was induced. They were then gently placed into dorsal lithotomy position with SCDs in place for VTE prophylaxis. They were prepped and draped in the usual sterile fashion and given appropriate preoperative antibiotics. A second timeout was then performed.   We inserted a 45F rigid cystoscope per urethra with copious lubrication  and normal saline irrigation running. This demonstrated findings as described above.    We cannulated the right ureteral orifice with the combination of a sensor wire and 5Fr open ended catheter and the sensor wire was advanced into the expected location of the renal pelvis without difficulty. The 5Fr open ended catheter was then advanced over the sensor wire into the right ureter and the sensor wire was removed. A retrograde pyelogram was performed with findings as noted above. We then replaced the sensor wire into the right kidney and the 5Fr open ended catheter was removed. We then removed the cystoscope leaving our sensor wire in place.  We then obtained a semirigid ureteroscope and advanced this alongside our sensor wire to the level of the stone. Wethen obtained a 200 micron holmium laser fiber and performed laser lithotripsy until all of the stone burden was fragmented into several small fragments which were basket extracted using a zero-tip nitinol wire basket without complication. Stone fragments were sent for chemical analysis.  We then obtained an 11/13Fr x 35cm ureteral access sheath which was easily passed over the sensor wire into the proximal ureter just distal to the UPJ under fluoroscopic guidance. The inner cannula was removed and was replaced by the flexible ureteroscope and ureteroscopy was performed. This demonstrated three separate renal calculi approximately 68mm in size that were adherent. These stones were each successfully basket extracted. Reexploration of our treatment site demonstrated no significant remaining stone burden.  At this point we elected to leave a ureteral stent and withdrew our instruments leaving a sensor wire in place. We then advanced a 6Fr x 24cm JJ ureteral stent with dangler with the assistance of a stent pusher under direct fluoroscopic & visual guidance without difficultly. Sensor wire removal demonstrated satisfactory stent curl proximally in  the renal  pelvis and distally in the bladder. The bladder was emptied and all instrumentation was removed. The patient was woken up from anesthesia and taken to the recovery unit for routine postoperative care.   Post Op Plan:   1. Discharge home when meets PACU criteria 2. Remove stent on string on POD#3, peri-stent pull abx prescribed 3. Follow up in the office in 6 weeks with RUS prior

## 2018-11-29 NOTE — Progress Notes (Signed)
1630 Spoke with Donia Guiles, LCSW [phone]) regarding follow up with Pt and any emotional/ potential Behavioral health resources he may need.  She assured Clinical research associate that she would attempt to reach Mr Viscomi tomorrow and provide him with community resources available should he need them.  Message sent to Dr Ronne Binning through his office staff regarding this matter.

## 2018-11-29 NOTE — Progress Notes (Signed)
During Pt's preop assessment he was at times teary.  During suicide assessment, he paused for several seconds before answering "no" to the question "Do you feel suicidal or have you felt like harming yourself." He said that he had had depression and issues in the past and sought help but those resources were unavailable currently.  Also that he was unable to take some of the meds that had been prescribed.  Mentioned several times that he felt the burden of being poor and working to help his mom pay the rent. Comments throughout the interview were very negative regarding himself. Call placed to inform Dr Ronne Binning of conversation with Pt and recommendation of followup in some capacity.

## 2018-11-30 ENCOUNTER — Encounter (HOSPITAL_COMMUNITY): Payer: Self-pay | Admitting: Urology

## 2018-11-30 ENCOUNTER — Telehealth (HOSPITAL_COMMUNITY): Payer: Self-pay | Admitting: Licensed Clinical Social Worker

## 2019-06-18 IMAGING — CR DG CHEST 2V
2 series · 2 of 2 positions shown · non-contrast
Comparison: PA and lateral chest 02/18/2013.

CLINICAL DATA: Cough and fever.

EXAM:
CHEST - 2 VIEW

[w chest pa]
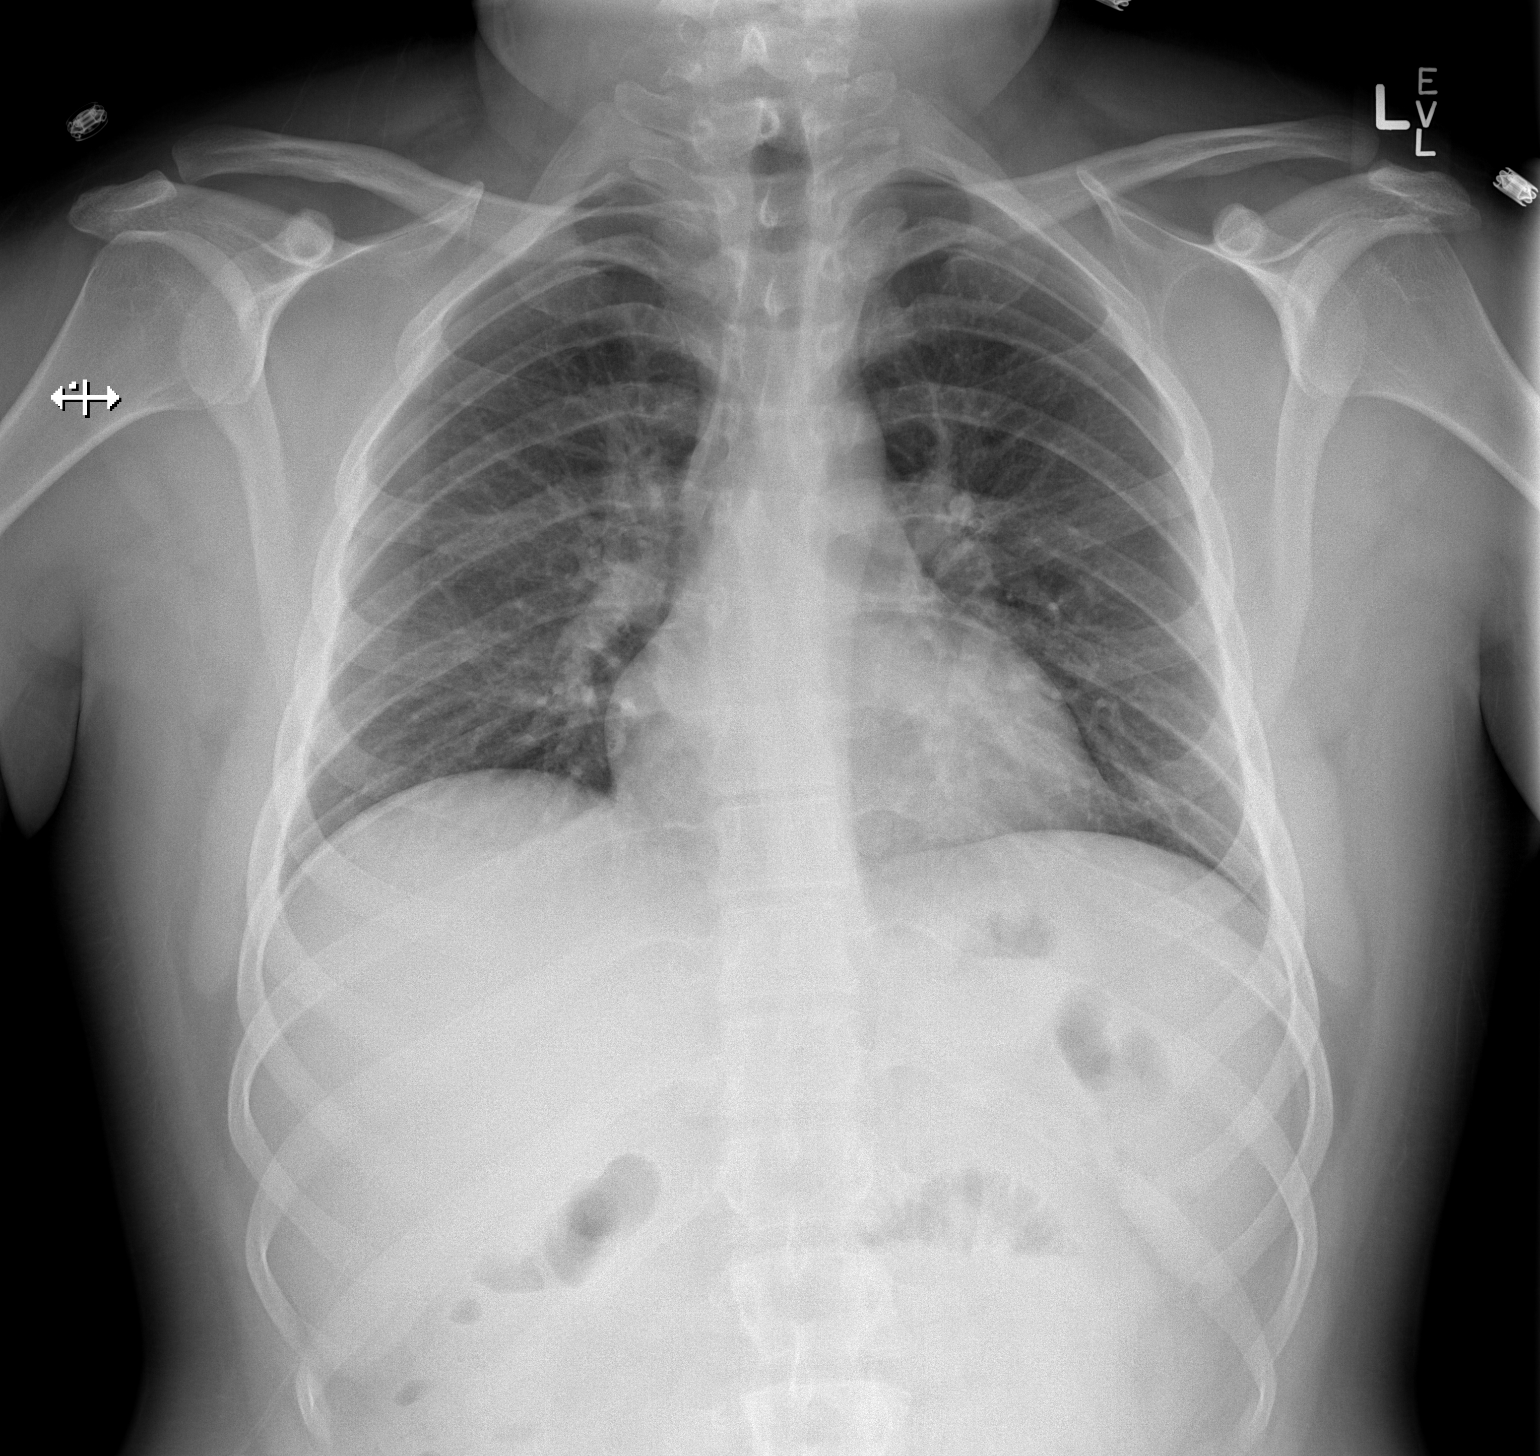

[w chest lat]
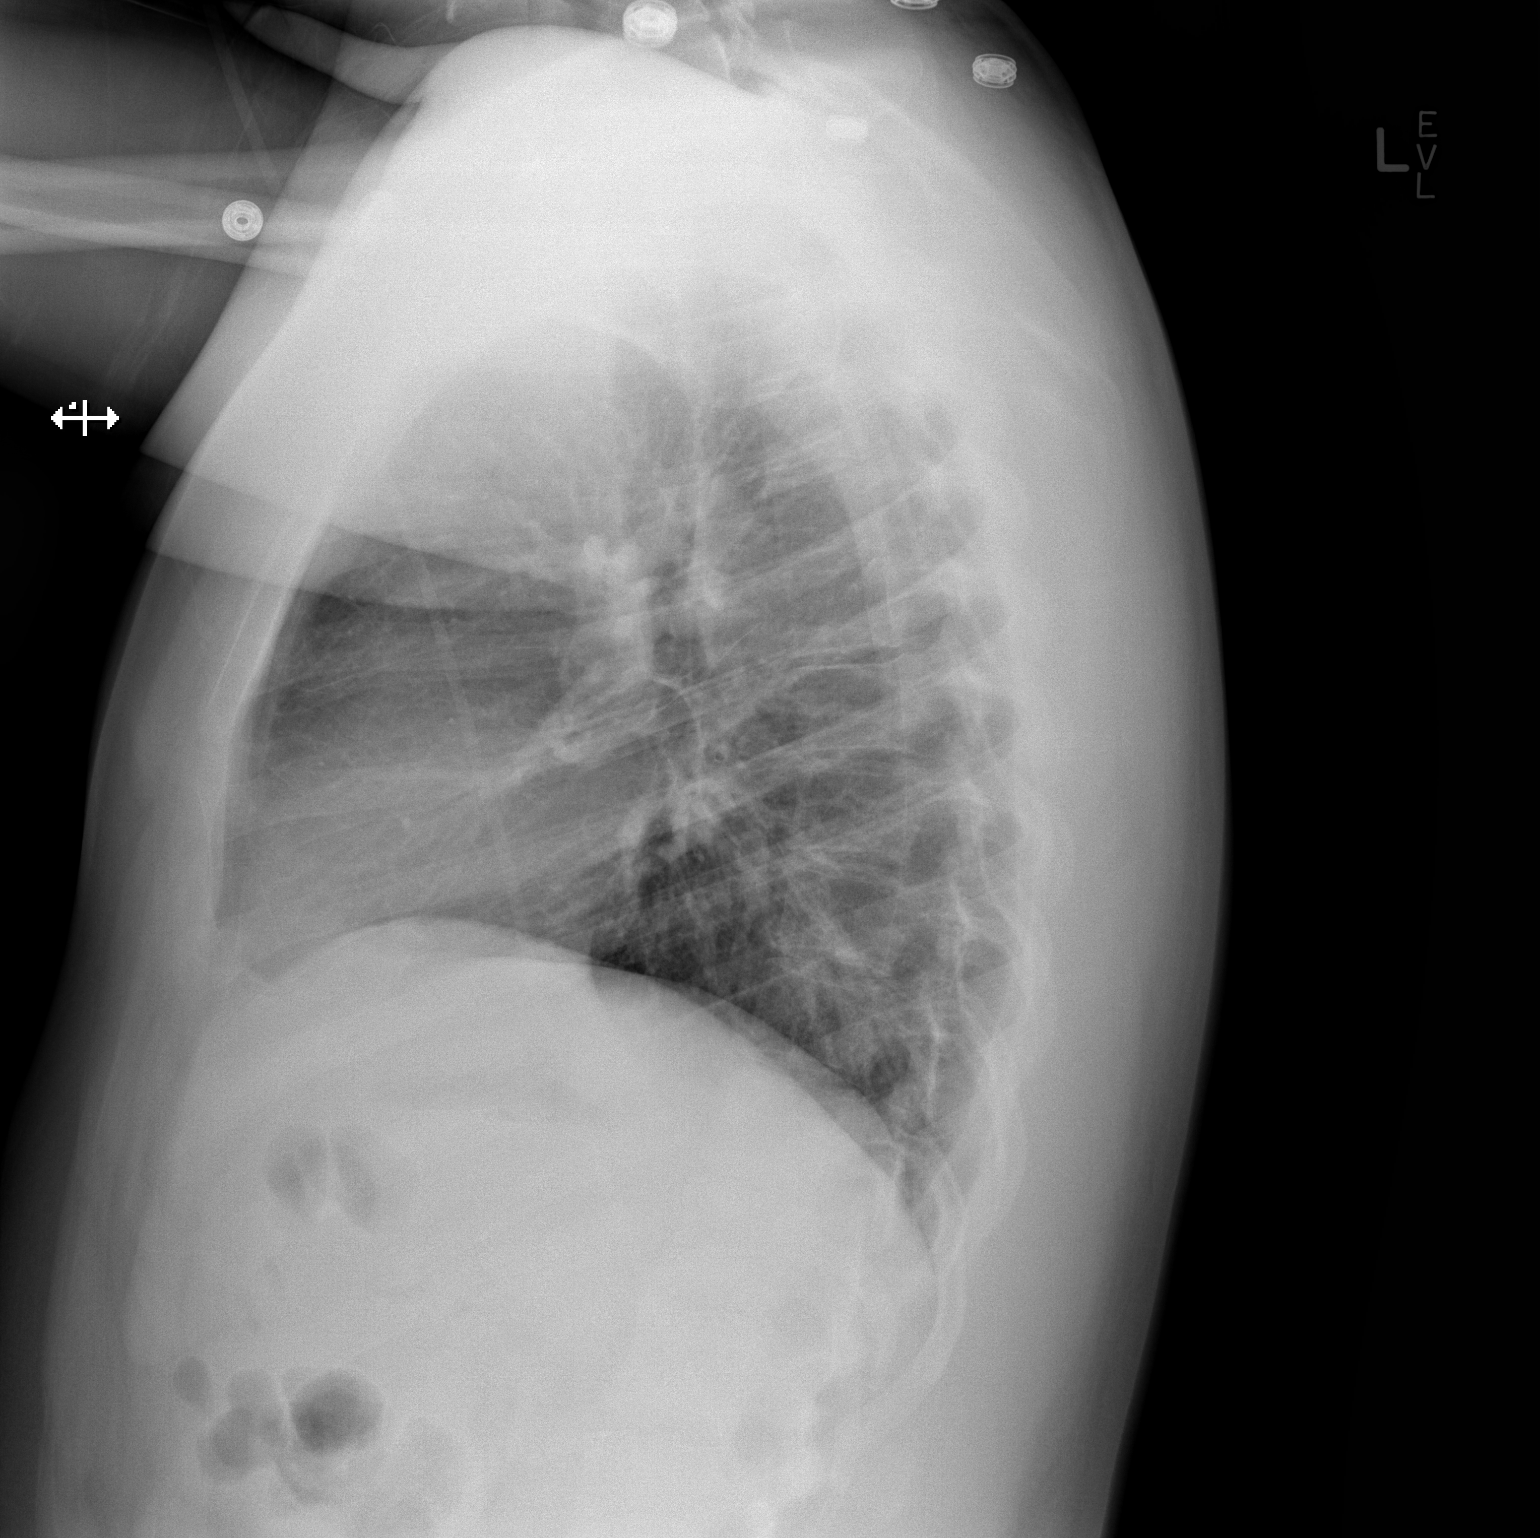

[2 of 2 positions shown; findings below may reference images not displayed]

FINDINGS: The lungs are clear. Heart size is normal. No pneumothorax or
pleural fluid. No focal bony abnormality.
IMPRESSION: Negative chest.

## 2020-04-23 ENCOUNTER — Emergency Department (HOSPITAL_COMMUNITY)
Admission: EM | Admit: 2020-04-23 | Discharge: 2020-04-23 | Disposition: A | Discharge status: Home / Self Care | Payer: No Typology Code available for payment source | Attending: Emergency Medicine | Admitting: Emergency Medicine

## 2020-04-23 ENCOUNTER — Other Ambulatory Visit: Payer: Self-pay

## 2020-04-23 ENCOUNTER — Emergency Department (HOSPITAL_COMMUNITY): Payer: No Typology Code available for payment source

## 2020-04-23 ENCOUNTER — Encounter (HOSPITAL_COMMUNITY): Payer: Self-pay | Admitting: Emergency Medicine

## 2020-04-23 DIAGNOSIS — Y990 Civilian activity done for income or pay: Secondary | ICD-10-CM | POA: Insufficient documentation

## 2020-04-23 DIAGNOSIS — W11XXXA Fall on and from ladder, initial encounter: Secondary | ICD-10-CM | POA: Diagnosis not present

## 2020-04-23 DIAGNOSIS — Y929 Unspecified place or not applicable: Secondary | ICD-10-CM | POA: Diagnosis not present

## 2020-04-23 DIAGNOSIS — R110 Nausea: Secondary | ICD-10-CM | POA: Diagnosis not present

## 2020-04-23 DIAGNOSIS — W19XXXA Unspecified fall, initial encounter: Secondary | ICD-10-CM

## 2020-04-23 DIAGNOSIS — Y939 Activity, unspecified: Secondary | ICD-10-CM | POA: Insufficient documentation

## 2020-04-23 DIAGNOSIS — H538 Other visual disturbances: Secondary | ICD-10-CM | POA: Insufficient documentation

## 2020-04-23 DIAGNOSIS — Z042 Encounter for examination and observation following work accident: Secondary | ICD-10-CM | POA: Diagnosis present

## 2020-04-23 MED ORDER — ACETAMINOPHEN 325 MG PO TABS
650.0000 mg | ORAL_TABLET | Freq: Once | ORAL | Status: AC
  Administered 2020-04-23: 650 mg via ORAL
  Filled 2020-04-23: qty 2

## 2020-04-23 NOTE — ED Notes (Signed)
Ice pack given

## 2020-04-23 NOTE — Consult Note (Signed)
Chief Complaint   Chief Complaint  Patient presents with  . Fall    HPI   Consult requested by: Bluford Main, EDP [facility] Reason for consult: cerebral contusion  HPI: Tanner Sharp is a 24 y.o. male with history of anemia and ADHD who presented to the ED after fall at work. He was moving boxes up and down a ladder when he fell approx 2 steps up landing on back and striking head. No LOC. He has since been having concussive type symptoms including occipital headache, double vision when looking upward, nausea without vomiting, dizziness with standing. No N/T/W. He underwent work up by EDP and was found to have a questionable occipital contusion vs artifact. A NS consultation was requested. Continues to have a HA although improved since being in the ED. Not on any blood thinning medications.  Patient Active Problem List   Diagnosis Date Noted  . Anemia 02/18/2012  . ADHD (attention deficit hyperactivity disorder), combined type 02/18/2012    PMH: Past Medical History:  Diagnosis Date  . ADHD   . Allergy   . Anemia   . Depression   . Eczema   . Kidney stones   . Lactose intolerance   . Social anxiety disorder     PSH: Past Surgical History:  Procedure Laterality Date  . CYSTOSCOPY WITH STENT PLACEMENT Right 11/29/2018   Procedure: CYSTOSCOPY WITH STENT PLACEMENT;  Surgeon: Malen Gauze, MD;  Location: [facility];  Service: Urology;  Laterality: Right;  . CYSTOSCOPY/RETROGRADE/URETEROSCOPY/STONE EXTRACTION WITH BASKET Right 11/29/2018   Procedure: CYSTOSCOPY/RETROGRADE/URETEROSCOPY/STONE EXTRACTION WITH BASKET;  Surgeon: Malen Gauze, MD;  Location: [facility];  Service: Urology;  Laterality: Right;  90 MINS  . HOLMIUM LASER APPLICATION Right 11/29/2018   Procedure: HOLMIUM LASER APPLICATION;  Surgeon: Malen Gauze, MD;  Location: [facility];  Service: Urology;  Laterality: Right;    (Not in a hospital admission)   SH: Social History   Tobacco Use  . Smoking status:  Never Smoker  . Smokeless tobacco: Never Used  Substance Use Topics  . Alcohol use: Never    Alcohol/week: 0.0 standard drinks  . Drug use: Never    MEDS: Prior to Admission medications   Medication Sig Start Date End Date Taking? Authorizing Provider  acetaminophen (TYLENOL) 325 MG tablet Take 650 mg by mouth every 6 (six) hours as needed for moderate pain (back pain).    [provider]  fexofenadine (ALLEGRA) 180 MG tablet Take 180 mg by mouth daily as needed for allergies.     [provider]  HYDROcodone-acetaminophen (NORCO) 5-325 MG tablet Take 2 tablets by mouth every 6 (six) hours as needed for moderate pain. 11/29/18   Lucretia Roers, Case M, MD  ibuprofen (ADVIL,MOTRIN) 200 MG tablet Take 800 mg by mouth every 6 (six) hours as needed for fever or moderate pain.     [provider]  ondansetron (ZOFRAN) 4 MG tablet Take 1 tablet (4 mg total) by mouth every 8 (eight) hours as needed for nausea or vomiting. Patient not taking: Reported on 11/19/2018 04/30/18   Arthor Captain, PA-C  tamsulosin (FLOMAX) 0.4 MG CAPS capsule One qd to help pass stone Patient taking differently: Take 0.4 mg by mouth daily. One qd to help pass stone 10/23/18   Rodriguez-Southworth, Nettie Elm, PA-C    ALLERGY: Allergies  Allergen Reactions  . Pollen Extract Swelling    Itchy, eyes swell, rash   . Other     Dairy and nuts and  Cat  Dander (Swelling Eyes, Throat, SOB)    Social History   Tobacco Use  . Smoking status: Never Smoker  . Smokeless tobacco: Never Used  Substance Use Topics  . Alcohol use: Never    Alcohol/week: 0.0 standard drinks     Family History  Problem Relation Age of Onset  . Bipolar disorder Father      ROS   Review of Systems  Constitutional: Negative.   HENT: Negative.   Eyes: Positive for double vision. Negative for blurred vision and photophobia.  Respiratory: Negative.   Cardiovascular: Negative.   Gastrointestinal: Positive for nausea. Negative  for vomiting.  Genitourinary: Negative.   Musculoskeletal: Positive for falls and myalgias. Negative for back pain, joint pain and neck pain.  Skin: Negative.   Neurological: Positive for dizziness, tingling and headaches. Negative for tremors, sensory change, speech change, focal weakness and weakness.   Exam   Vitals:   04/23/20 1358  BP: 128/86  Pulse: 64  Resp: 16  Temp: 98.5 F (36.9 C)  SpO2: 99%   General appearance: WDWN, NAD Eyes: No scleral injection Cardiovascular: Regular rate and rhythm without murmurs, rubs, gallops. No edema or variciosities. Distal pulses normal. Pulmonary: Effort normal, non-labored breathing Musculoskeletal:     Muscle tone upper extremities: Normal    Muscle tone lower extremities: Normal    Motor exam: Upper Extremities Deltoid Bicep Tricep Grip  Right 5/5 5/5 5/5 5/5  Left 5/5 5/5 5/5 5/5   Lower Extremity IP Quad PF DF EHL  Right 5/5 5/5 5/5 5/5 5/5  Left 5/5 5/5 5/5 5/5 5/5   Neurological Mental Status:    - Patient is awake, alert, oriented to person, place, month, year, and situation    - Patient is able to give a clear and coherent history.    - No signs of aphasia or neglect Cranial Nerves    - II: Visual Fields are full. PERRL    - III/IV/VI: EOMI without ptosis or diploplia.     - V: Facial sensation is grossly normal    - VII: Facial movement is symmetric.     - VIII: hearing is intact to voice    - X: Uvula elevates symmetrically    - XI: Shoulder shrug is symmetric.    - XII: tongue is midline without atrophy or fasciculations.  Sensory: Sensation grossly intact to LT  Results - Imaging/Labs   No results found for this or any previous visit (from the past 48 hour(s)).  CT Head Wo Contrast  Result Date: 04/23/2020 CLINICAL DATA:  Headache, posttraumatic. Neck trauma, midline tenderness. Additional history provided: Slip and fall at work on tile floor, patient reportedly hit back of head, patient reports dull  headache. EXAM: CT HEAD WITHOUT CONTRAST CT CERVICAL SPINE WITHOUT CONTRAST TECHNIQUE: Multidetector CT imaging of the head and cervical spine was performed following the standard protocol without intravenous contrast. Multiplanar CT image reconstructions of the cervical spine were also generated. COMPARISON:  No pertinent prior studies available for comparison. FINDINGS: CT HEAD FINDINGS Brain: There is an apparent small focus of hypodensity within the left occipital pole (series 4, image 9) (series 4, image 63) (series 5, image 37). This may reflect beam hardening artifact arising from the adjacent calvarium. In the setting of trauma, it is difficult to exclude a nonhemorrhagic parenchymal contusion at this site. There is no acute intracranial hemorrhage. No extra-axial fluid collection. No evidence of intracranial mass. No midline shift. Vascular: No hyperdense vessel. Skull: Normal. Negative for  fracture or focal lesion. Sinuses/Orbits: Visualized orbits show no acute finding. Moderate mucosal thickening within the right maxillary sinus with possible mucous retention cysts. Mild to moderate ethmoid sinus mucosal thickening. Mild mucosal thickening is also present within the sphenoid and left maxillary sinuses. No significant mastoid effusion at the imaged levels. CT CERVICAL SPINE FINDINGS Alignment: Cervical dextrocurvature. Straightening of the expected cervical lordosis. No significant spondylolisthesis. Skull base and vertebrae: The basion-dental and atlanto-dental intervals are maintained.No evidence of acute fracture to the cervical spine. Soft tissues and spinal canal: No prevertebral fluid or swelling. No visible canal hematoma. Disc levels: No significant bony spinal canal or neural foraminal narrowing at any level. Upper chest: No consolidation within the imaged lung apices. No visible pneumothorax. IMPRESSION: CT head: 1. An apparent small focus of hypodensity within the left occipital pole may  reflect beam hardening artifact or a non-hemorrhagic parenchymal contusion. Consider CT head follow-up or brain MRI for further evaluation. 2. Otherwise unremarkable CT appearance of the brain. 3. Paranasal sinus disease as described. CT cervical spine: 1. No evidence of acute fracture to the cervical spine. 2. Cervical dextrocurvature. Electronically Signed   By: Kellie Simmering DO   On: 04/23/2020 17:50   CT Cervical Spine Wo Contrast  Result Date: 04/23/2020 CLINICAL DATA:  Headache, posttraumatic. Neck trauma, midline tenderness. Additional history provided: Slip and fall at work on tile floor, patient reportedly hit back of head, patient reports dull headache. EXAM: CT HEAD WITHOUT CONTRAST CT CERVICAL SPINE WITHOUT CONTRAST TECHNIQUE: Multidetector CT imaging of the head and cervical spine was performed following the standard protocol without intravenous contrast. Multiplanar CT image reconstructions of the cervical spine were also generated. COMPARISON:  No pertinent prior studies available for comparison. FINDINGS: CT HEAD FINDINGS Brain: There is an apparent small focus of hypodensity within the left occipital pole (series 4, image 9) (series 4, image 63) (series 5, image 37). This may reflect beam hardening artifact arising from the adjacent calvarium. In the setting of trauma, it is difficult to exclude a nonhemorrhagic parenchymal contusion at this site. There is no acute intracranial hemorrhage. No extra-axial fluid collection. No evidence of intracranial mass. No midline shift. Vascular: No hyperdense vessel. Skull: Normal. Negative for fracture or focal lesion. Sinuses/Orbits: Visualized orbits show no acute finding. Moderate mucosal thickening within the right maxillary sinus with possible mucous retention cysts. Mild to moderate ethmoid sinus mucosal thickening. Mild mucosal thickening is also present within the sphenoid and left maxillary sinuses. No significant mastoid effusion at the imaged  levels. CT CERVICAL SPINE FINDINGS Alignment: Cervical dextrocurvature. Straightening of the expected cervical lordosis. No significant spondylolisthesis. Skull base and vertebrae: The basion-dental and atlanto-dental intervals are maintained.No evidence of acute fracture to the cervical spine. Soft tissues and spinal canal: No prevertebral fluid or swelling. No visible canal hematoma. Disc levels: No significant bony spinal canal or neural foraminal narrowing at any level. Upper chest: No consolidation within the imaged lung apices. No visible pneumothorax. IMPRESSION: CT head: 1. An apparent small focus of hypodensity within the left occipital pole may reflect beam hardening artifact or a non-hemorrhagic parenchymal contusion. Consider CT head follow-up or brain MRI for further evaluation. 2. Otherwise unremarkable CT appearance of the brain. 3. Paranasal sinus disease as described. CT cervical spine: 1. No evidence of acute fracture to the cervical spine. 2. Cervical dextrocurvature. Electronically Signed   By: Kellie Simmering DO   On: 04/23/2020 17:50   Impression/Plan   24 y.o. male will questionable small occipital  contusion vs artifact noted on head CT after a fall at work. Although concussed, he is neurologically intact. There is no role for NS intervention. I do not think he needs a repeat head CT or admission. I discussed this with the patient and his mom at bedside. They agree. Expected course and management discussed. No need for outpatient f/u although I told him we would be happy to see him as needed.   Cindra Presume, PA-C [facility]    Cindra Presume, PA-C [facility]

## 2020-04-23 NOTE — ED Notes (Signed)
ED PA to bedside to update pt

## 2020-04-23 NOTE — Discharge Instructions (Addendum)
Please take Tylenol (acetaminophen) to relieve your pain.  You may take tylenol, up to 1,000 mg (two extra strength pills).  Do not take more than 3,000 mg tylenol in a 24 hour period.  Please check all medication labels as many medications such as pain and cold medications may contain tylenol. Please do not drink alcohol while taking this medication.  ° °

## 2020-04-23 NOTE — ED Notes (Signed)
ED PA notified pt requesting something else for pain.

## 2020-04-23 NOTE — ED Provider Notes (Signed)
[facility] DEPARTMENT Provider Note   CSN: [id number] Arrival date & time: 04/23/20  1315     History Chief Complaint  Patient presents with  . Fall    Yoniel Arkwright is a 24 y.o. male with a past medical history of ADHD, depression, eczema, kidney stones, who presents today for evaluation after fall.  He was at work and stepped off of the ladder when the foot that was on the ground and slid out from under him causing him to fall backward striking his head.  He does not believe he passed out.  He reports mild nausea without vomiting.  He reports mild vision changes, specifically when he lifts his arm up.  He describes it as feeling like he is looking at a 3D image without wearing 3D glasses.  He does not take any blood thinning medications.  No interventions tried prior to arrival.  HPI     Past Medical History:  Diagnosis Date  . ADHD   . Allergy   . Anemia   . Depression   . Eczema   . Kidney stones   . Lactose intolerance   . Social anxiety disorder     Patient Active Problem List   Diagnosis Date Noted  . Anemia 02/18/2012  . ADHD (attention deficit hyperactivity disorder), combined type 02/18/2012    Past Surgical History:  Procedure Laterality Date  . CYSTOSCOPY WITH STENT PLACEMENT Right 11/29/2018   Procedure: CYSTOSCOPY WITH STENT PLACEMENT;  Surgeon: Cleon Gustin, MD;  Location: [facility];  Service: Urology;  Laterality: Right;  . CYSTOSCOPY/RETROGRADE/URETEROSCOPY/STONE EXTRACTION WITH BASKET Right 11/29/2018   Procedure: CYSTOSCOPY/RETROGRADE/URETEROSCOPY/STONE EXTRACTION WITH BASKET;  Surgeon: Cleon Gustin, MD;  Location: [facility];  Service: Urology;  Laterality: Right;  90 MINS  . HOLMIUM LASER APPLICATION Right 02/23/1447   Procedure: HOLMIUM LASER APPLICATION;  Surgeon: Cleon Gustin, MD;  Location: [facility];  Service: Urology;  Laterality: Right;       Family History  Problem Relation Age of Onset  . Bipolar  disorder Father     Social History   Tobacco Use  . Smoking status: Never Smoker  . Smokeless tobacco: Never Used  Substance Use Topics  . Alcohol use: Never    Alcohol/week: 0.0 standard drinks  . Drug use: Never    Home Medications Prior to Admission medications   Medication Sig Start Date End Date Taking? Authorizing Provider  acetaminophen (TYLENOL) 325 MG tablet Take 650 mg by mouth every 6 (six) hours as needed for moderate pain (back pain).    [provider]  fexofenadine (ALLEGRA) 180 MG tablet Take 180 mg by mouth daily as needed for allergies.     [provider]  HYDROcodone-acetaminophen (NORCO) 5-325 MG tablet Take 2 tablets by mouth every 6 (six) hours as needed for moderate pain. 11/29/18   Clydene Laming, Case M, MD  ibuprofen (ADVIL,MOTRIN) 200 MG tablet Take 800 mg by mouth every 6 (six) hours as needed for fever or moderate pain.     [provider]  ondansetron (ZOFRAN) 4 MG tablet Take 1 tablet (4 mg total) by mouth every 8 (eight) hours as needed for nausea or vomiting. Patient not taking: Reported on 11/19/2018 04/30/18   Margarita Mail, PA-C  tamsulosin (FLOMAX) 0.4 MG CAPS capsule One qd to help pass stone Patient taking differently: Take 0.4 mg by mouth daily. One qd to help pass stone 10/23/18   Rodriguez-Southworth, Sunday Spillers, PA-C    Allergies  Pollen extract and Other  Review of Systems   Review of Systems  Constitutional: Negative for chills and fever.  Eyes: Positive for visual disturbance.  Respiratory: Negative for shortness of breath.   Gastrointestinal: Negative for abdominal pain.  Musculoskeletal: Positive for neck pain. Negative for back pain.  Neurological: Positive for headaches. Negative for weakness.  All other systems reviewed and are negative.   Physical Exam Updated Vital Signs BP 109/66 (BP Location: Left Arm)   Pulse (!) 57   Temp 97.8 F (36.6 C) (Oral)   Resp 18   Ht 5\' 4"  (1.626 m)   Wt 86.2 kg   SpO2  100%   BMI 32.61 kg/m   Physical Exam Vitals and nursing note reviewed.  Constitutional:      General: He is not in acute distress.    Appearance: He is well-developed. He is not diaphoretic.  HENT:     Head: Normocephalic and atraumatic.     Comments: No raccoon's eyes or battle signs Eyes:     General: No scleral icterus.       Right eye: No discharge.        Left eye: No discharge.     Conjunctiva/sclera: Conjunctivae normal.  Neck:     Comments: Diffuse midline tenderness to palpation Cardiovascular:     Rate and Rhythm: Normal rate and regular rhythm.  Pulmonary:     Effort: Pulmonary effort is normal. No respiratory distress.     Breath sounds: No stridor.  Abdominal:     General: There is no distension.  Musculoskeletal:        General: No deformity.  Skin:    General: Skin is warm and dry.  Neurological:     General: No focal deficit present.     Mental Status: He is alert and oriented to person, place, and time.     Cranial Nerves: No cranial nerve deficit.     Motor: No weakness or abnormal muscle tone.     Gait: Gait normal.  Psychiatric:        Mood and Affect: Mood normal.        Behavior: Behavior normal.     ED Results / Procedures / Treatments   Labs (all labs ordered are listed, but only abnormal results are displayed) Labs Reviewed - No data to display  EKG None  Radiology CT Head Wo Contrast  Result Date: 04/23/2020 CLINICAL DATA:  Headache, posttraumatic. Neck trauma, midline tenderness. Additional history provided: Slip and fall at work on tile floor, patient reportedly hit back of head, patient reports dull headache. EXAM: CT HEAD WITHOUT CONTRAST CT CERVICAL SPINE WITHOUT CONTRAST TECHNIQUE: Multidetector CT imaging of the head and cervical spine was performed following the standard protocol without intravenous contrast. Multiplanar CT image reconstructions of the cervical spine were also generated. COMPARISON:  No pertinent prior studies  available for comparison. FINDINGS: CT HEAD FINDINGS Brain: There is an apparent small focus of hypodensity within the left occipital pole (series 4, image 9) (series 4, image 63) (series 5, image 37). This may reflect beam hardening artifact arising from the adjacent calvarium. In the setting of trauma, it is difficult to exclude a nonhemorrhagic parenchymal contusion at this site. There is no acute intracranial hemorrhage. No extra-axial fluid collection. No evidence of intracranial mass. No midline shift. Vascular: No hyperdense vessel. Skull: Normal. Negative for fracture or focal lesion. Sinuses/Orbits: Visualized orbits show no acute finding. Moderate mucosal thickening within the right maxillary sinus with possible mucous  retention cysts. Mild to moderate ethmoid sinus mucosal thickening. Mild mucosal thickening is also present within the sphenoid and left maxillary sinuses. No significant mastoid effusion at the imaged levels. CT CERVICAL SPINE FINDINGS Alignment: Cervical dextrocurvature. Straightening of the expected cervical lordosis. No significant spondylolisthesis. Skull base and vertebrae: The basion-dental and atlanto-dental intervals are maintained.No evidence of acute fracture to the cervical spine. Soft tissues and spinal canal: No prevertebral fluid or swelling. No visible canal hematoma. Disc levels: No significant bony spinal canal or neural foraminal narrowing at any level. Upper chest: No consolidation within the imaged lung apices. No visible pneumothorax. IMPRESSION: CT head: 1. An apparent small focus of hypodensity within the left occipital pole may reflect beam hardening artifact or a non-hemorrhagic parenchymal contusion. Consider CT head follow-up or brain MRI for further evaluation. 2. Otherwise unremarkable CT appearance of the brain. 3. Paranasal sinus disease as described. CT cervical spine: 1. No evidence of acute fracture to the cervical spine. 2. Cervical dextrocurvature.  Electronically Signed   By: Jackey Loge DO   On: 04/23/2020 17:50   CT Cervical Spine Wo Contrast  Result Date: 04/23/2020 CLINICAL DATA:  Headache, posttraumatic. Neck trauma, midline tenderness. Additional history provided: Slip and fall at work on tile floor, patient reportedly hit back of head, patient reports dull headache. EXAM: CT HEAD WITHOUT CONTRAST CT CERVICAL SPINE WITHOUT CONTRAST TECHNIQUE: Multidetector CT imaging of the head and cervical spine was performed following the standard protocol without intravenous contrast. Multiplanar CT image reconstructions of the cervical spine were also generated. COMPARISON:  No pertinent prior studies available for comparison. FINDINGS: CT HEAD FINDINGS Brain: There is an apparent small focus of hypodensity within the left occipital pole (series 4, image 9) (series 4, image 63) (series 5, image 37). This may reflect beam hardening artifact arising from the adjacent calvarium. In the setting of trauma, it is difficult to exclude a nonhemorrhagic parenchymal contusion at this site. There is no acute intracranial hemorrhage. No extra-axial fluid collection. No evidence of intracranial mass. No midline shift. Vascular: No hyperdense vessel. Skull: Normal. Negative for fracture or focal lesion. Sinuses/Orbits: Visualized orbits show no acute finding. Moderate mucosal thickening within the right maxillary sinus with possible mucous retention cysts. Mild to moderate ethmoid sinus mucosal thickening. Mild mucosal thickening is also present within the sphenoid and left maxillary sinuses. No significant mastoid effusion at the imaged levels. CT CERVICAL SPINE FINDINGS Alignment: Cervical dextrocurvature. Straightening of the expected cervical lordosis. No significant spondylolisthesis. Skull base and vertebrae: The basion-dental and atlanto-dental intervals are maintained.No evidence of acute fracture to the cervical spine. Soft tissues and spinal canal: No prevertebral  fluid or swelling. No visible canal hematoma. Disc levels: No significant bony spinal canal or neural foraminal narrowing at any level. Upper chest: No consolidation within the imaged lung apices. No visible pneumothorax. IMPRESSION: CT head: 1. An apparent small focus of hypodensity within the left occipital pole may reflect beam hardening artifact or a non-hemorrhagic parenchymal contusion. Consider CT head follow-up or brain MRI for further evaluation. 2. Otherwise unremarkable CT appearance of the brain. 3. Paranasal sinus disease as described. CT cervical spine: 1. No evidence of acute fracture to the cervical spine. 2. Cervical dextrocurvature. Electronically Signed   By: Jackey Loge DO   On: 04/23/2020 17:50    Procedures Procedures (including critical care time)  Medications Ordered in ED Medications  acetaminophen (TYLENOL) tablet 650 mg (650 mg Oral Given 04/23/20 1530)    ED Course  I  have reviewed the triage vital signs and the nursing notes.  Pertinent labs & imaging results that were available during my care of the patient were reviewed by me and considered in my medical decision making (see chart for details).  Clinical Course as of Apr 24 2131  Mon Apr 23, 2020  1922 I spoke with PA Costello who will see patient.    [EH]    Clinical Course User Index [EH] Norman Clay   MDM Rules/Calculators/A&P                     Patient is a 24 year old man who presents today for evaluation of a fall from standing height.  He reports a slight visual disturbance however is otherwise neurologically intact.  I discussed with patient and his mother role of imaging, and they wish for CT scan.  Given that he reports visual disturbance I feel this is reasonable.  CT head and neck were obtained showing concern for a possible nonhemorrhagic contusion.  Neurosurgery was consulted, spoke with Colbert Coyer, neurosurgical PA who will come see the patient.  Vinny recommended discharge home with  outpatient follow-up if needed.  He is given follow up with the concussion clinic, concussion precautions and work note.    Return precautions were discussed with patient who states their understanding.  At the time of discharge patient denied any unaddressed complaints or concerns.  Patient is agreeable for discharge home.  Note: Portions of this report may have been transcribed using voice recognition software. Every effort was made to ensure accuracy; however, inadvertent computerized transcription errors may be present  Final Clinical Impression(s) / ED Diagnoses Final diagnoses:  Fall, initial encounter    Rx / DC Orders ED Discharge Orders    None       Norman Clay 04/23/20 2137    Gerhard Munch, MD 04/23/20 2351

## 2020-04-23 NOTE — ED Notes (Signed)
Discharge instructions discussed with pt. Pt verbalized understanding. Pt stable and ambulatory. No signature pad available. 

## 2020-04-23 NOTE — ED Triage Notes (Signed)
Pt arrives to ED from work with complaints of slipping on tile floor when getting off a ladder at work. Patient hit the back of his head. Patient denies LOC but has dull headache.

## 2020-08-19 ENCOUNTER — Ambulatory Visit (INDEPENDENT_AMBULATORY_CARE_PROVIDER_SITE_OTHER): Payer: Self-pay

## 2020-08-19 ENCOUNTER — Other Ambulatory Visit: Payer: Self-pay

## 2020-08-19 ENCOUNTER — Ambulatory Visit (HOSPITAL_COMMUNITY)
Admission: EM | Admit: 2020-08-19 | Discharge: 2020-08-19 | Disposition: A | Discharge status: Home / Self Care | Payer: Self-pay

## 2020-08-19 ENCOUNTER — Encounter (HOSPITAL_COMMUNITY): Payer: Self-pay | Admitting: *Deleted

## 2020-08-19 DIAGNOSIS — R109 Unspecified abdominal pain: Secondary | ICD-10-CM

## 2020-08-19 DIAGNOSIS — R509 Fever, unspecified: Secondary | ICD-10-CM

## 2020-08-19 HISTORY — DX: Anxiety disorder, unspecified: F41.9

## 2020-08-19 LAB — POCT URINALYSIS DIPSTICK, ED / UC
Bilirubin Urine: NEGATIVE
Glucose, UA: NEGATIVE mg/dL
Hgb urine dipstick: NEGATIVE
Ketones, ur: NEGATIVE mg/dL
Leukocytes,Ua: NEGATIVE
Nitrite: NEGATIVE
Protein, ur: NEGATIVE mg/dL
Specific Gravity, Urine: 1.025 (ref 1.005–1.030)
Urobilinogen, UA: 0.2 mg/dL (ref 0.0–1.0)
pH: 6 (ref 5.0–8.0)

## 2020-08-19 MED ORDER — TAMSULOSIN HCL 0.4 MG PO CAPS: 0.4000 mg | ORAL_CAPSULE | Freq: Every day | ORAL | 0 refills | Status: AC

## 2020-08-19 MED ORDER — TRAMADOL HCL 50 MG PO TABS: 50.0000 mg | ORAL_TABLET | Freq: Four times a day (QID) | ORAL | 0 refills | Status: AC | PRN

## 2020-08-19 MED ORDER — CYCLOBENZAPRINE HCL 10 MG PO TABS: 10.0000 mg | ORAL_TABLET | Freq: Two times a day (BID) | ORAL | 0 refills | Status: AC | PRN

## 2020-08-19 NOTE — ED Triage Notes (Signed)
C/O constant left flank pain x 1 wk.  Area tender to palpation.  States has noticed "flecks in my pee". Temps up to 101 x 5 days. Has been taking Tyl and gabapentin for pain.

## 2020-08-19 NOTE — ED Provider Notes (Signed)
[facility]   CC: UTI  SUBJECTIVE:  Tanner Sharp is a 24 y.o. male who complains of flank pain, fever and seeing "flecks in urine." Patient denies a precipitating event, recent sexual encounter, excessive caffeine intake. Localizes the pain to the flank. Reports hx kidney stones. Pain is intermittent and describes it as sharp. Has tried OTC medications without relief. Symptoms are made worse with urination. Admits to similar symptoms in the past.  Denies  chills, nausea, vomiting, abdominal pain,  hematuria.    ROS: As in HPI.  All other pertinent ROS negative.     Past Medical History:  Diagnosis Date  . ADHD   . Allergy   . Anemia   . Anxiety   . Depression   . Eczema   . Kidney stones   . Lactose intolerance   . Social anxiety disorder    Past Surgical History:  Procedure Laterality Date  . CYSTOSCOPY WITH STENT PLACEMENT Right 11/29/2018   Procedure: CYSTOSCOPY WITH STENT PLACEMENT;  Surgeon: Malen Gauze, MD;  Location: [facility];  Service: Urology;  Laterality: Right;  . CYSTOSCOPY/RETROGRADE/URETEROSCOPY/STONE EXTRACTION WITH BASKET Right 11/29/2018   Procedure: CYSTOSCOPY/RETROGRADE/URETEROSCOPY/STONE EXTRACTION WITH BASKET;  Surgeon: Malen Gauze, MD;  Location: [facility];  Service: Urology;  Laterality: Right;  90 MINS  . HOLMIUM LASER APPLICATION Right 11/29/2018   Procedure: HOLMIUM LASER APPLICATION;  Surgeon: Malen Gauze, MD;  Location: [facility];  Service: Urology;  Laterality: Right;   Allergies  Allergen Reactions  . Pollen Extract Swelling    Itchy, eyes swell, rash   . Other     Dairy and nuts and  Cat Dander (Swelling Eyes, Throat, SOB)   No current facility-administered medications on file prior to encounter.   Current Outpatient Medications on File Prior to Encounter  Medication Sig Dispense Refill  . acetaminophen (TYLENOL) 325 MG tablet Take 650 mg by mouth every 6 (six) hours as needed for moderate pain (back pain).    Marland Kitchen  diphenhydrAMINE HCl (BENADRYL PO) Take by mouth.    . Ferrous Sulfate (IRON PO) Take by mouth.    . fexofenadine (ALLEGRA) 180 MG tablet Take 180 mg by mouth daily as needed for allergies.     . fluticasone (FLONASE) 50 MCG/ACT nasal spray Place into both nostrils daily.    Marland Kitchen GABAPENTIN PO Take by mouth.    Marland Kitchen guaiFENesin (MUCINEX PO) Take by mouth.    . Triamcinolone Acetonide (NASACORT AQ NA) Place into the nose.    . ibuprofen (ADVIL,MOTRIN) 200 MG tablet Take 800 mg by mouth every 6 (six) hours as needed for fever or moderate pain.     Marland Kitchen ondansetron (ZOFRAN) 4 MG tablet Take 1 tablet (4 mg total) by mouth every 8 (eight) hours as needed for nausea or vomiting. (Patient not taking: Reported on 11/19/2018) 10 tablet 0   Social History   Socioeconomic History  . Marital status: Single    Spouse name: Not on file  . Number of children: Not on file  . Years of education: Not on file  . Highest education level: Not on file  Occupational History  . Not on file  Tobacco Use  . Smoking status: Never Smoker  . Smokeless tobacco: Never Used  Vaping Use  . Vaping Use: Never used  Substance and Sexual Activity  . Alcohol use: Yes    Comment: occasionally  . Drug use: Never  . Sexual activity: Not on file  Other Topics Concern  .  Not on file  Social History Narrative  . Not on file   Social Determinants of Health   Financial Resource Strain:   . Difficulty of Paying Living Expenses: Not on file  Food Insecurity:   . Worried About Programme researcher, broadcasting/film/video in the Last Year: Not on file  . Ran Out of Food in the Last Year: Not on file  Transportation Needs:   . Lack of Transportation (Medical): Not on file  . Lack of Transportation (Non-Medical): Not on file  Physical Activity:   . Days of Exercise per Week: Not on file  . Minutes of Exercise per Session: Not on file  Stress:   . Feeling of Stress : Not on file  Social Connections:   . Frequency of Communication with Friends and Family:  Not on file  . Frequency of Social Gatherings with Friends and Family: Not on file  . Attends Religious Services: Not on file  . Active Member of Clubs or Organizations: Not on file  . Attends Banker Meetings: Not on file  . Marital Status: Not on file  Intimate Partner Violence:   . Fear of Current or Ex-Partner: Not on file  . Emotionally Abused: Not on file  . Physically Abused: Not on file  . Sexually Abused: Not on file   Family History  Problem Relation Age of Onset  . Bipolar disorder Father     OBJECTIVE:  Vitals:   08/19/20 1750  BP: 127/83  Pulse: 81  Resp: 16  Temp: 97.9 F (36.6 C)  TempSrc: Oral  SpO2: 99%   General appearance: AOx3 in no acute distress HEENT: NCAT. Oropharynx clear.  Lungs: clear to auscultation bilaterally without adventitious breath sounds Heart: regular rate and rhythm. Radial pulses 2+ symmetrical bilaterally Abdomen: soft; non-distended; no tenderness; bowel sounds present; no guarding or rebound tenderness Back: CVA tenderness Extremities: no edema; symmetrical with no gross deformities Skin: warm and dry Neurologic: Ambulates from chair to exam table without difficulty Psychological: alert and cooperative; normal mood and affect  Labs Reviewed  POCT URINALYSIS DIPSTICK, ED / UC    ASSESSMENT & PLAN:  1. Flank pain   2. Fever, unspecified fever cause     Meds ordered this encounter  Medications  . tamsulosin (FLOMAX) 0.4 MG CAPS capsule    Sig: Take 1 capsule (0.4 mg total) by mouth daily.    Dispense:  30 capsule    Refill:  0    Order Specific Question:   Supervising Provider    Answer:   Merrilee Jansky [id number]  . cyclobenzaprine (FLEXERIL) 10 MG tablet    Sig: Take 1 tablet (10 mg total) by mouth 2 (two) times daily as needed for muscle spasms.    Dispense:  20 tablet    Refill:  0    Order Specific Question:   Supervising Provider    Answer:   Merrilee Jansky [id number]  . traMADol (ULTRAM) 50  MG tablet    Sig: Take 1 tablet (50 mg total) by mouth every 6 (six) hours as needed.    Dispense:  15 tablet    Refill:  0    Order Specific Question:   Supervising Provider    Answer:   Merrilee Jansky [id number]   UA negative for infection in office today Xray negative today Prescribed flomax Prescribed tramadol Prescribed cyclobenzaprine Will treat for kidney stone Push fluids and get plenty of rest Work note provided Follow up with PCP if  symptoms persists Return here or go to ER if you have any new or worsening symptoms such as fever, worsening abdominal pain, nausea/vomiting, flank pain  Outlined signs and symptoms indicating need for more acute intervention Patient verbalized understanding After Visit Summary given     Moshe Cipro, NP 08/20/20 1228

## 2020-08-19 NOTE — Discharge Instructions (Addendum)
I suspect that you have a kidney stone  I have sent in Flomax for you to take once daily  I have sent in tramadol for you to take 1 tablet every 6 hours as needed for pain  I have sent in Flexeril which is a muscle relaxer for you to take twice a day as needed for muscle spasms.  Do not drive or operate heavy machinery while taking this medication because it can make you sleepy.  I have sent in Pyridium to help with discomfort as well

## 2021-06-11 IMAGING — CT CT CERVICAL SPINE W/O CM
3 of 5 series · 10 of 35 positions shown, 12 images · non-contrast
Comparison: No pertinent prior studies available for comparison.

CLINICAL DATA: Headache, posttraumatic. Neck trauma, midline
tenderness. Additional history provided: Slip and fall at work on
tile floor, patient reportedly hit back of head, patient reports
dull headache.

EXAM:
CT HEAD WITHOUT CONTRAST
CT CERVICAL SPINE WITHOUT CONTRAST
TECHNIQUE: Multidetector CT imaging of the head and cervical spine was
performed following the standard protocol without intravenous
contrast. Multiplanar CT image reconstructions of the cervical spine
were also generated.

[Series 9: sag bone · sagittal · 0.24mm/px · 5 of 70 slices shown, 6 images]
[im 24/70  bone]
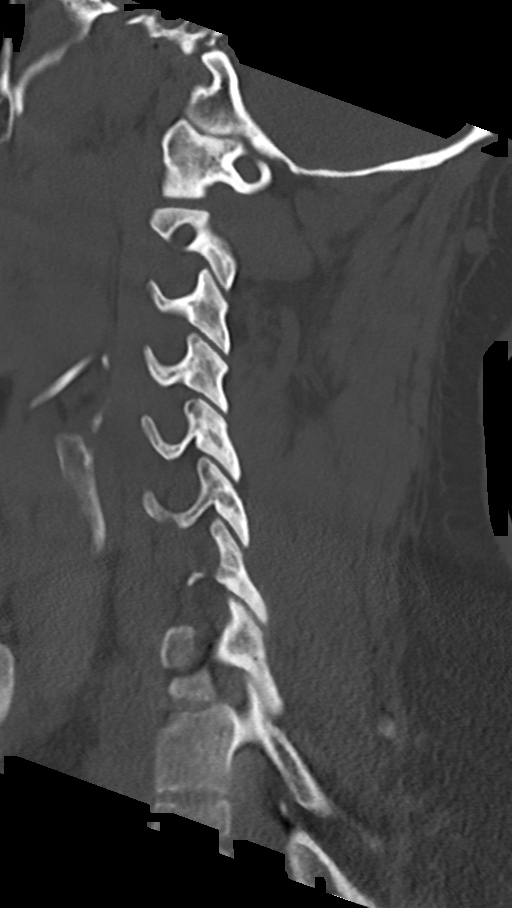
[im 29/70  bone]
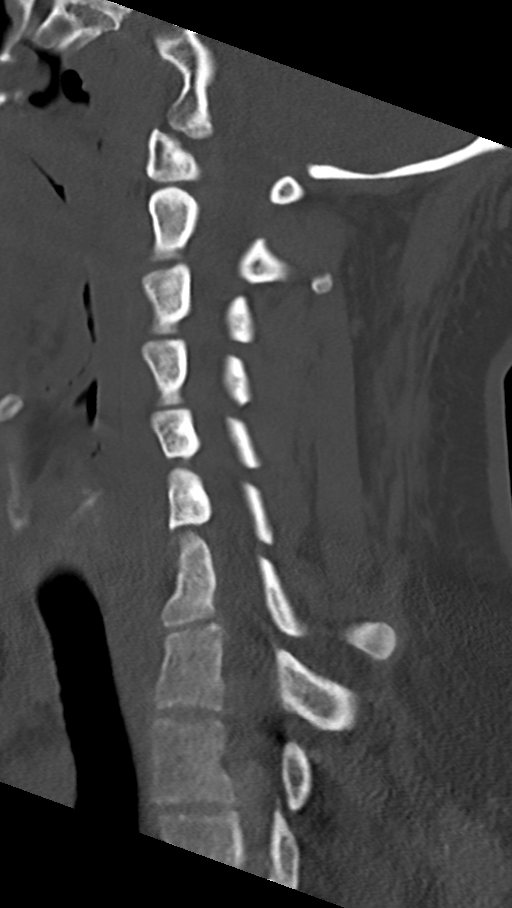
[im 35/70  soft-tissue]
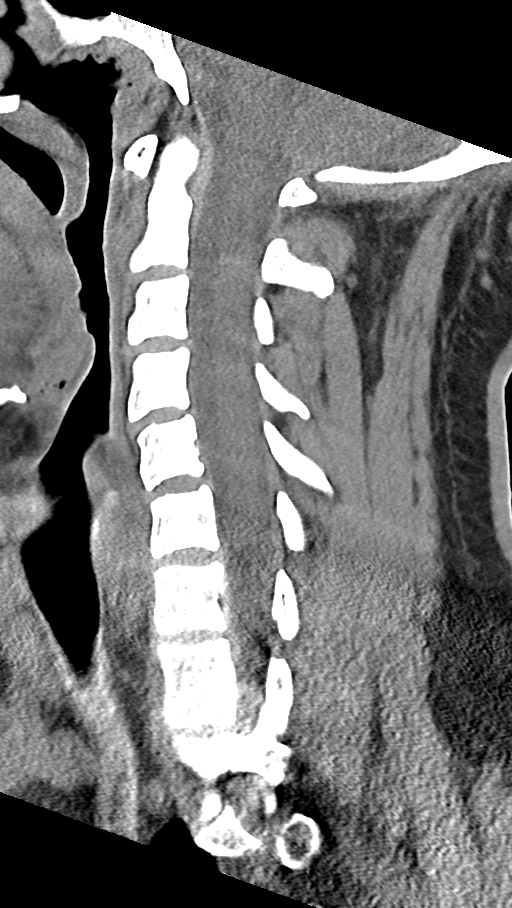
[im 35/70  bone]
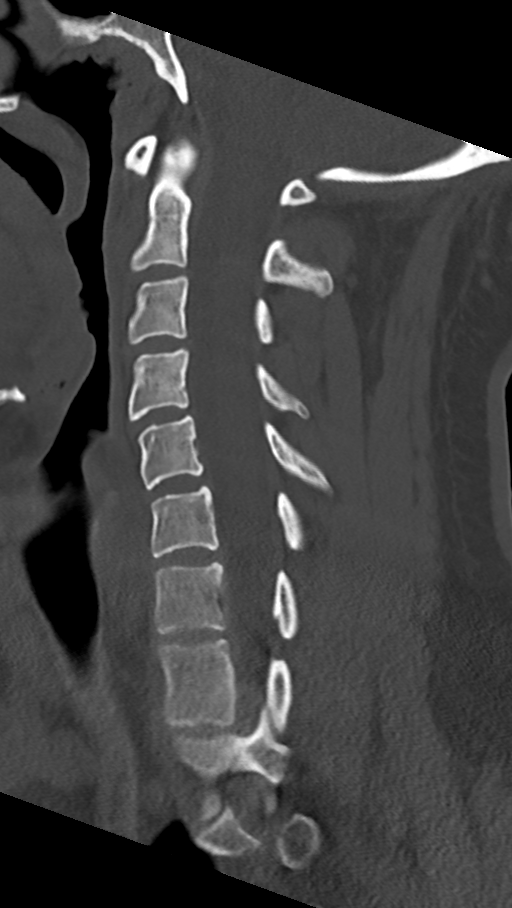
[im 41/70  bone]
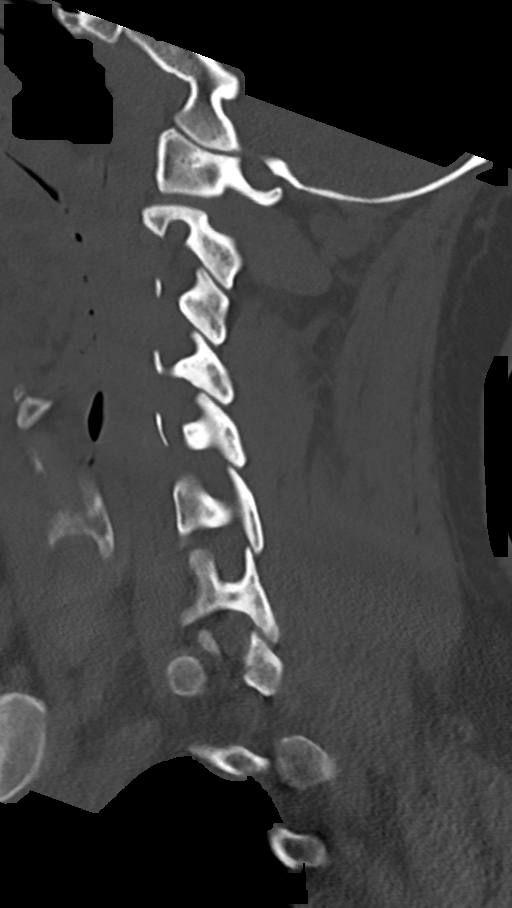
[im 47/70  bone]
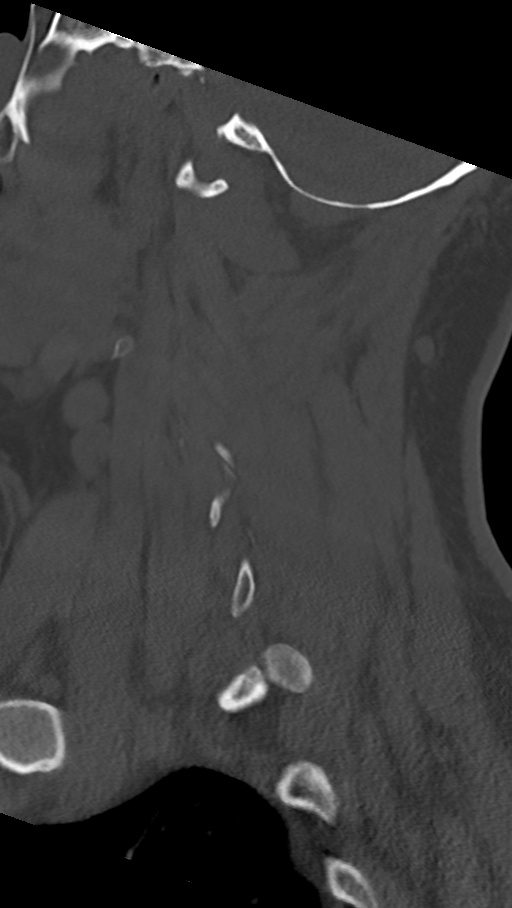

[Series 10: cor bone · coronal · 0.27mm/px · 3 of 60 slices shown]
[im 12/60  bone]
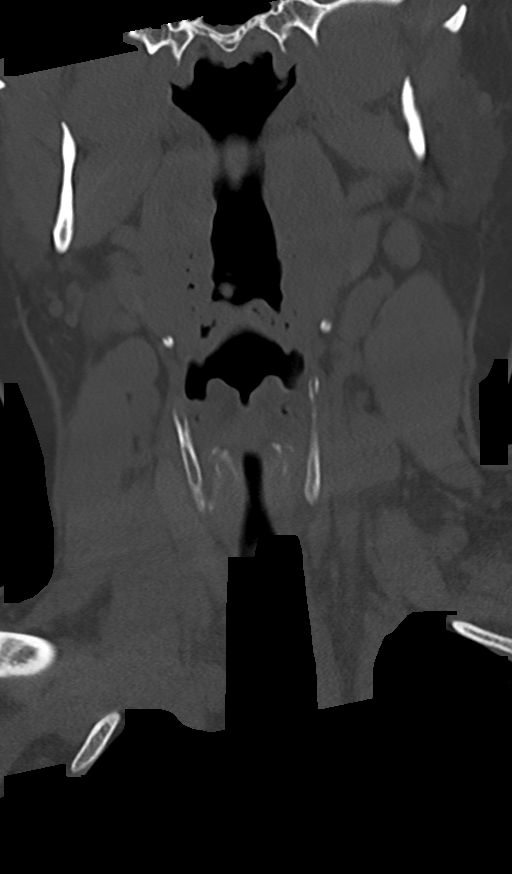
[im 24/60  bone]
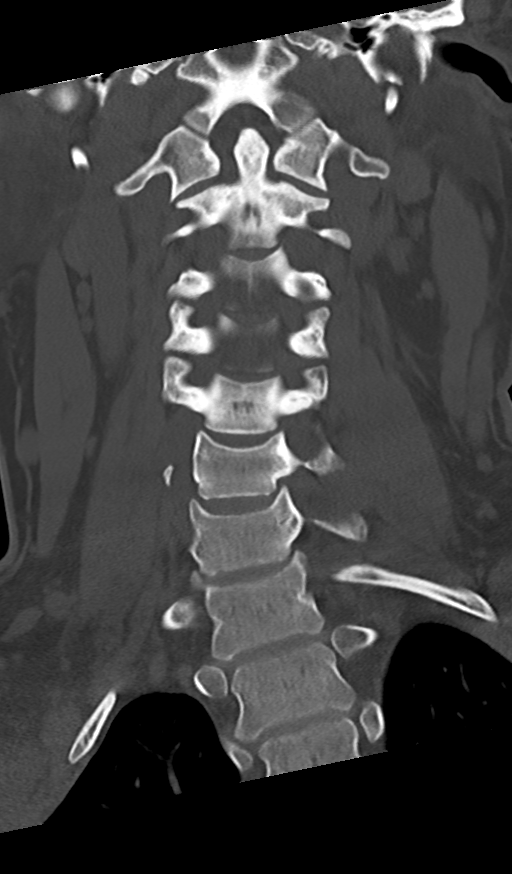
[im 36/60  bone]
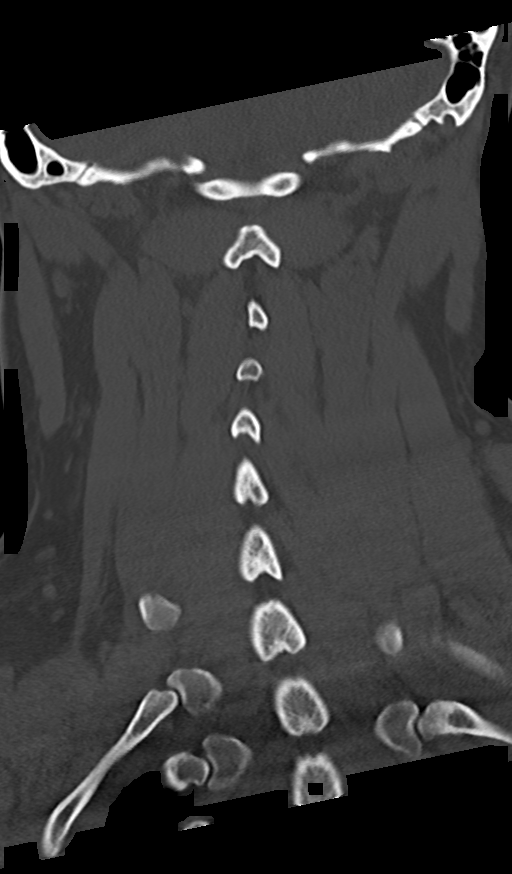

[Series 11: orthogonal axials · axial · 0.21mm/px · z∈[-252,-195]mm · 2 of 101 slices shown, 3 images]
[im 41/101  soft-tissue]
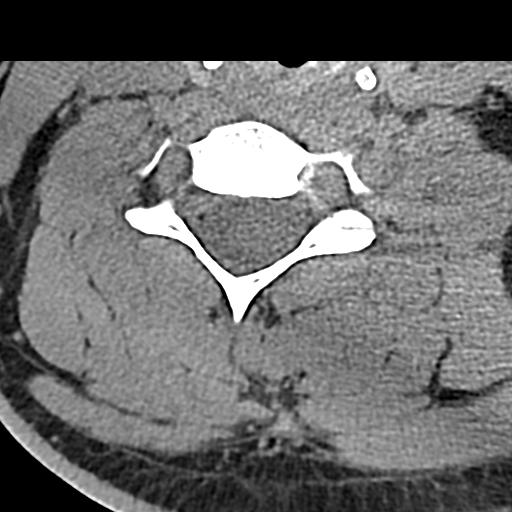
[im 41/101  bone]
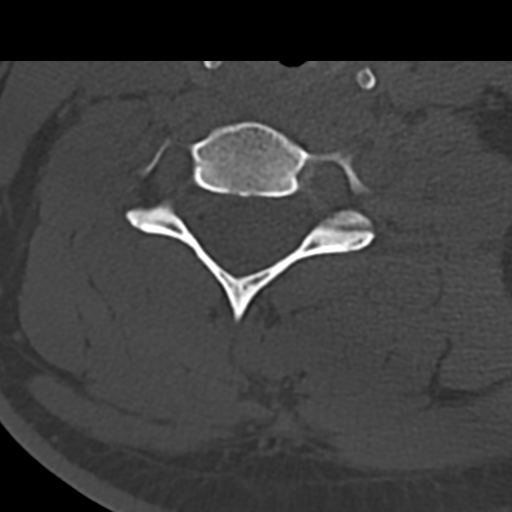
[im 81/101  bone]
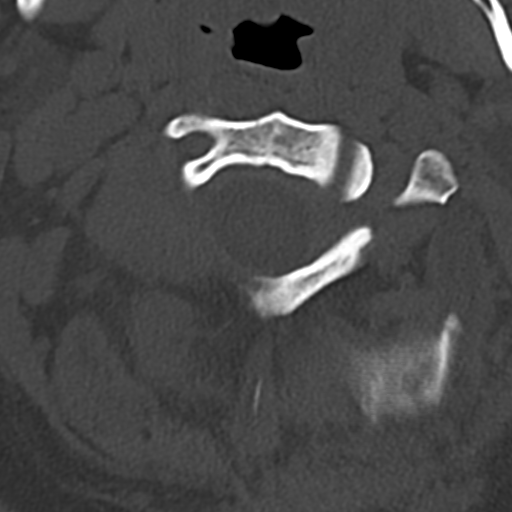

[10 of 35 positions shown; findings below may reference images not displayed]

FINDINGS: CT HEAD FINDINGS

Brain:

There is an apparent small focus of hypodensity within the left
occipital pole (series 4, image 9) (series 4, image 63) (series 5,
image 37). This may reflect beam hardening artifact arising from the
adjacent calvarium. In the setting of trauma, it is difficult to
exclude a nonhemorrhagic parenchymal contusion at this site.

There is no acute intracranial hemorrhage.

No extra-axial fluid collection.

No evidence of intracranial mass.

No midline shift.

Vascular: No hyperdense vessel.

Skull: Normal. Negative for fracture or focal lesion.

Sinuses/Orbits: Visualized orbits show no acute finding. Moderate
mucosal thickening within the right maxillary sinus with possible
mucous retention cysts. Mild to moderate ethmoid sinus mucosal
thickening. Mild mucosal thickening is also present within the
sphenoid and left maxillary sinuses. No significant mastoid effusion
at the imaged levels.

CT CERVICAL SPINE FINDINGS

Alignment: Cervical dextrocurvature. Straightening of the expected
cervical lordosis. No significant spondylolisthesis.

Skull base and vertebrae: The basion-dental and atlanto-dental
intervals are maintained.No evidence of acute fracture to the
cervical spine.

Soft tissues and spinal canal: No prevertebral fluid or swelling. No
visible canal hematoma.

Disc levels: No significant bony spinal canal or neural foraminal
narrowing at any level.

Upper chest: No consolidation within the imaged lung apices. No
visible pneumothorax.
IMPRESSION: CT head:

1. An apparent small focus of hypodensity within the left occipital
pole may reflect beam hardening artifact or a non-hemorrhagic
parenchymal contusion. Consider CT head follow-up or brain MRI for
further evaluation.
2. Otherwise unremarkable CT appearance of the brain.
3. Paranasal sinus disease as described.

CT cervical spine:

1. No evidence of acute fracture to the cervical spine.
2. Cervical dextrocurvature.

## 2021-06-11 IMAGING — CT CT HEAD W/O CM
3 series · 14 of 47 positions shown, 16 images · non-contrast
Comparison: No pertinent prior studies available for comparison.

CLINICAL DATA: Headache, posttraumatic. Neck trauma, midline
tenderness. Additional history provided: Slip and fall at work on
tile floor, patient reportedly hit back of head, patient reports
dull headache.

EXAM:
CT HEAD WITHOUT CONTRAST
CT CERVICAL SPINE WITHOUT CONTRAST
TECHNIQUE: Multidetector CT imaging of the head and cervical spine was
performed following the standard protocol without intravenous
contrast. Multiplanar CT image reconstructions of the cervical spine
were also generated.

[Series 1: head bone · axial · 0.42mm/px · z∈[-157,-23]mm · 8 of 79 slices shown, 10 images]
[im 6/79  brain]
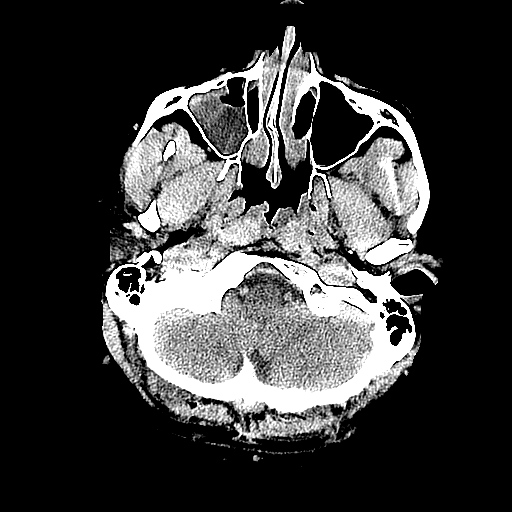
[im 6/79  bone]
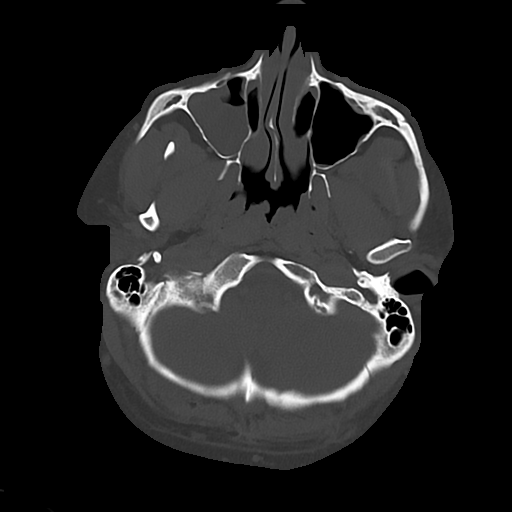
[im 17/79  brain]
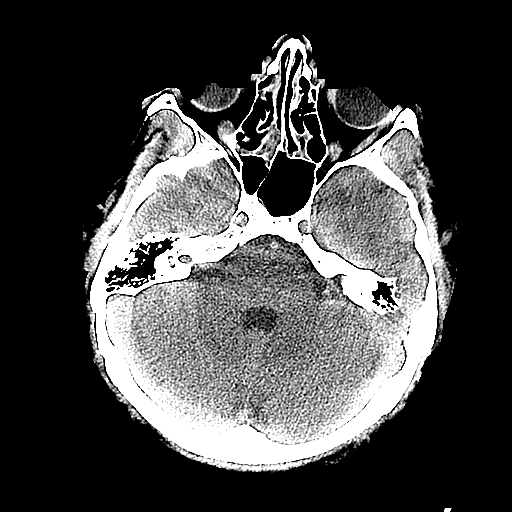
[im 25/79  brain]
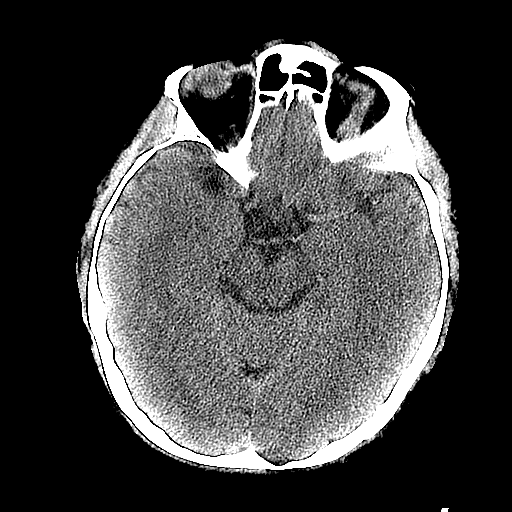
[im 35/79  brain]
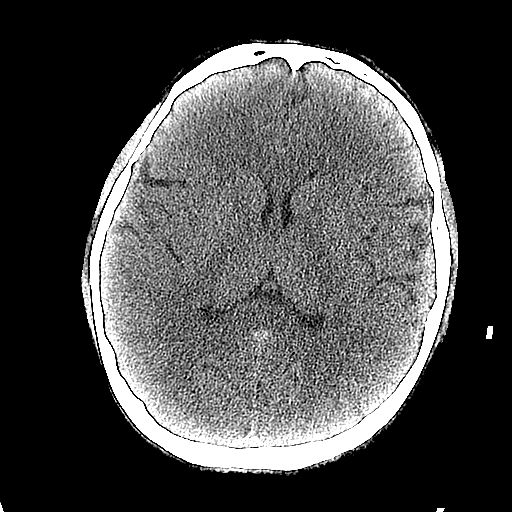
[im 44/79  brain]
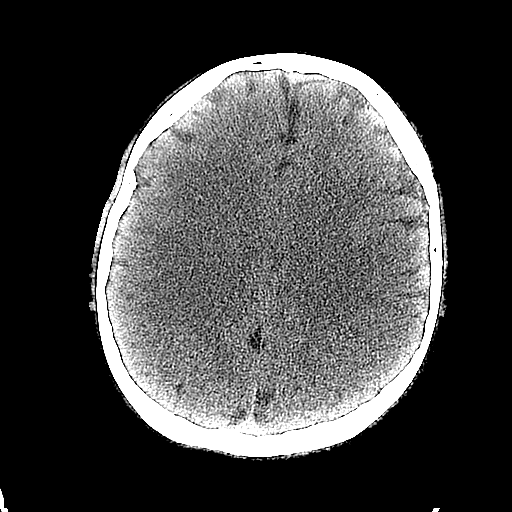
[im 44/79  bone]
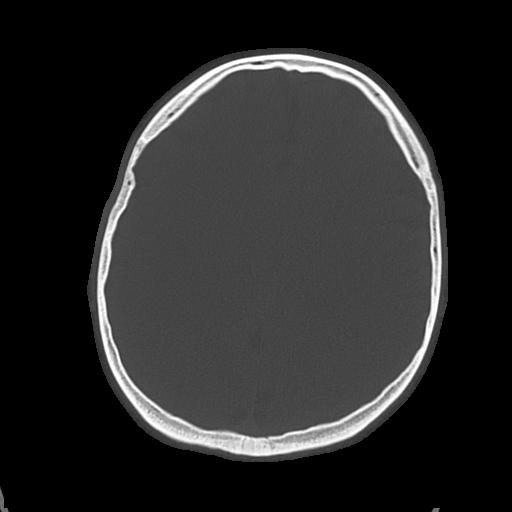
[im 54/79  brain]
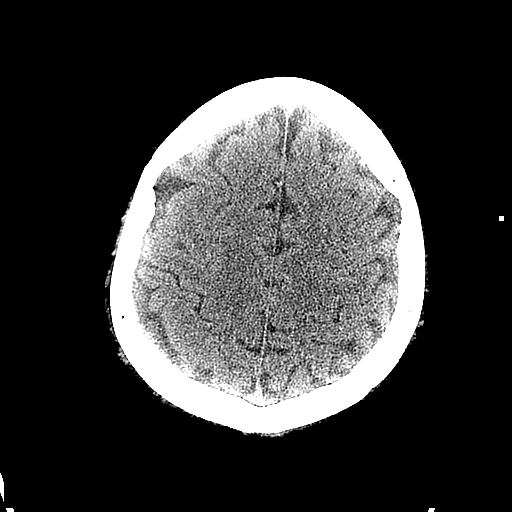
[im 62/79  brain]
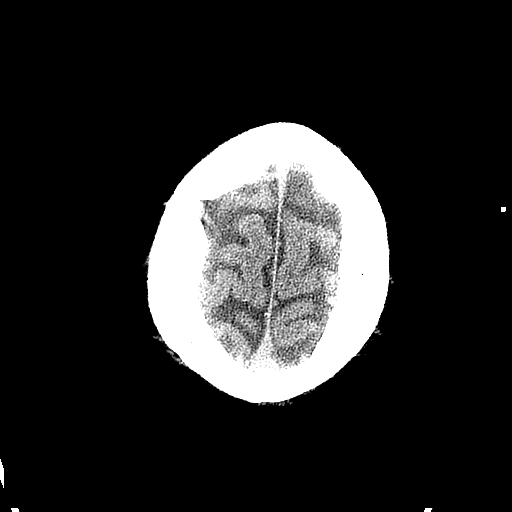
[im 73/79  brain]
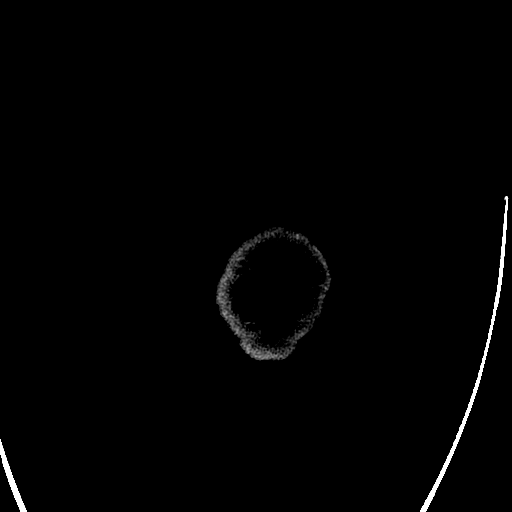

[Series 4: cor soft · coronal · 0.28mm/px · 3 of 72 slices shown]
[im 24/72  brain]
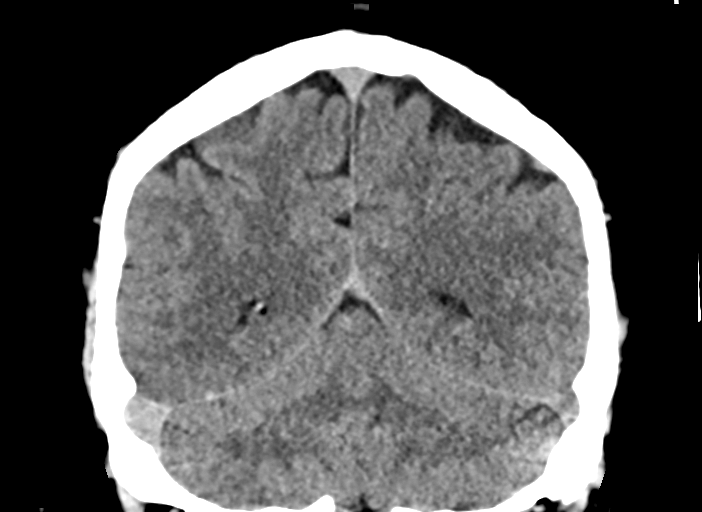
[im 32/72  brain]
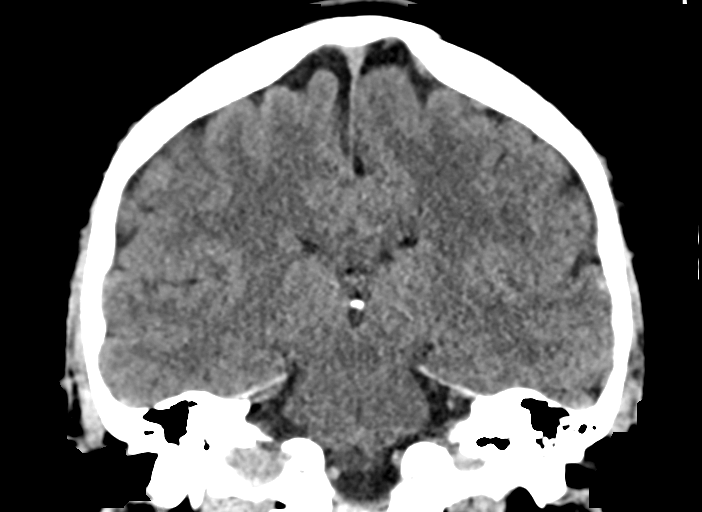
[im 40/72  brain]
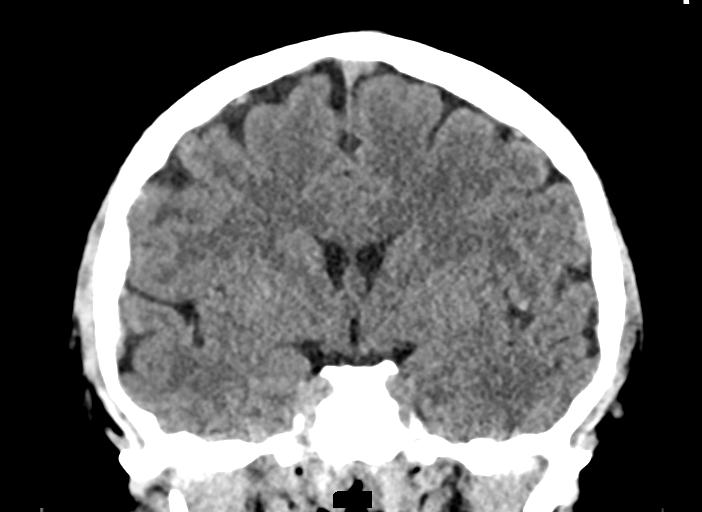

[Series 5: sag soft · sagittal · 0.28mm/px · 3 of 66 slices shown]
[im 22/66  brain]
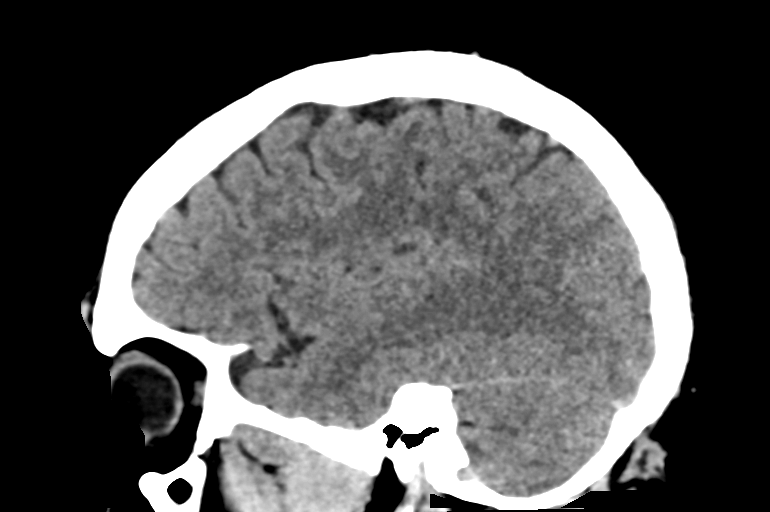
[im 33/66  brain]
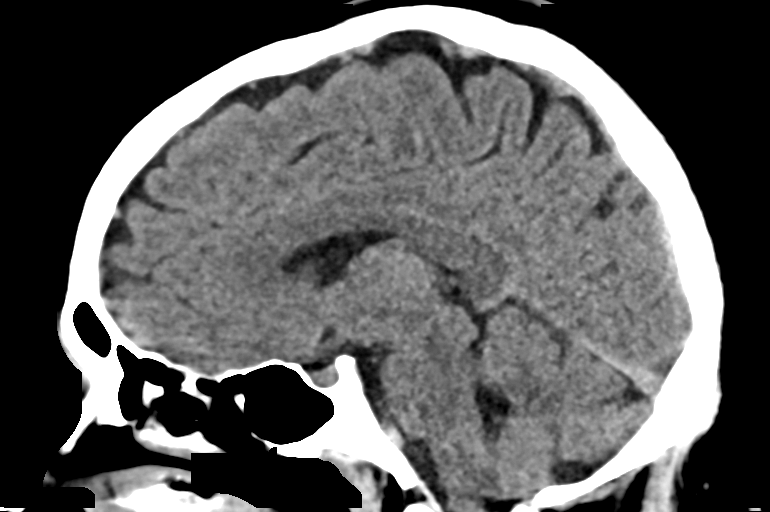
[im 44/66  brain]
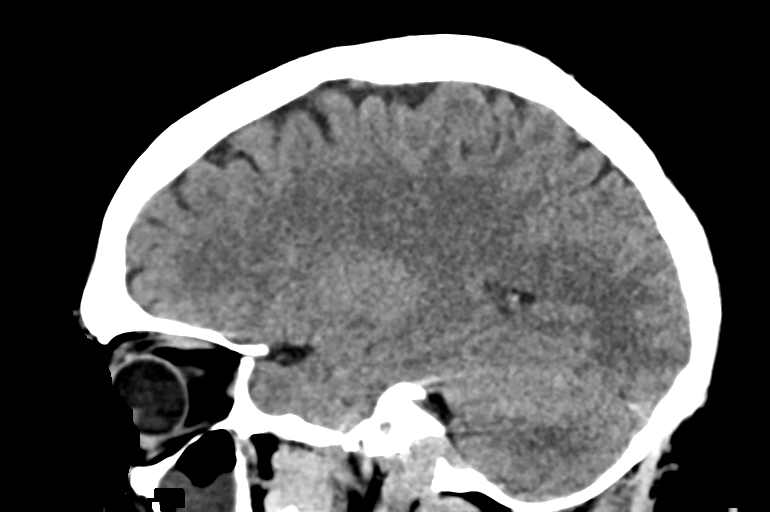

[14 of 47 positions shown; findings below may reference images not displayed]

FINDINGS: CT HEAD FINDINGS

Brain:

There is an apparent small focus of hypodensity within the left
occipital pole (series 4, image 9) (series 4, image 63) (series 5,
image 37). This may reflect beam hardening artifact arising from the
adjacent calvarium. In the setting of trauma, it is difficult to
exclude a nonhemorrhagic parenchymal contusion at this site.

There is no acute intracranial hemorrhage.

No extra-axial fluid collection.

No evidence of intracranial mass.

No midline shift.

Vascular: No hyperdense vessel.

Skull: Normal. Negative for fracture or focal lesion.

Sinuses/Orbits: Visualized orbits show no acute finding. Moderate
mucosal thickening within the right maxillary sinus with possible
mucous retention cysts. Mild to moderate ethmoid sinus mucosal
thickening. Mild mucosal thickening is also present within the
sphenoid and left maxillary sinuses. No significant mastoid effusion
at the imaged levels.

CT CERVICAL SPINE FINDINGS

Alignment: Cervical dextrocurvature. Straightening of the expected
cervical lordosis. No significant spondylolisthesis.

Skull base and vertebrae: The basion-dental and atlanto-dental
intervals are maintained.No evidence of acute fracture to the
cervical spine.

Soft tissues and spinal canal: No prevertebral fluid or swelling. No
visible canal hematoma.

Disc levels: No significant bony spinal canal or neural foraminal
narrowing at any level.

Upper chest: No consolidation within the imaged lung apices. No
visible pneumothorax.
IMPRESSION: CT head:

1. An apparent small focus of hypodensity within the left occipital
pole may reflect beam hardening artifact or a non-hemorrhagic
parenchymal contusion. Consider CT head follow-up or brain MRI for
further evaluation.
2. Otherwise unremarkable CT appearance of the brain.
3. Paranasal sinus disease as described.

CT cervical spine:

1. No evidence of acute fracture to the cervical spine.
2. Cervical dextrocurvature.

## 2022-07-17 ENCOUNTER — Encounter (HOSPITAL_COMMUNITY): Payer: Self-pay

## 2022-07-17 ENCOUNTER — Emergency Department (HOSPITAL_COMMUNITY)
Admission: EM | Admit: 2022-07-17 | Discharge: 2022-07-17 | Disposition: A | Discharge status: Home / Self Care | Payer: Medicaid Other | Attending: Emergency Medicine | Admitting: Emergency Medicine

## 2022-07-17 ENCOUNTER — Other Ambulatory Visit: Payer: Self-pay

## 2022-07-17 DIAGNOSIS — R42 Dizziness and giddiness: Secondary | ICD-10-CM | POA: Insufficient documentation

## 2022-07-17 DIAGNOSIS — Z79899 Other long term (current) drug therapy: Secondary | ICD-10-CM | POA: Insufficient documentation

## 2022-07-17 DIAGNOSIS — R7309 Other abnormal glucose: Secondary | ICD-10-CM | POA: Insufficient documentation

## 2022-07-17 LAB — CBC
HCT: 45.2 % (ref 39.0–52.0)
Hemoglobin: 15.3 g/dL (ref 13.0–17.0)
MCH: 29.1 pg (ref 26.0–34.0)
MCHC: 33.8 g/dL (ref 30.0–36.0)
MCV: 86.1 fL (ref 80.0–100.0)
Platelets: 289 10*3/uL (ref 150–400)
RBC: 5.25 MIL/uL (ref 4.22–5.81)
RDW: 12.9 % (ref 11.5–15.5)
WBC: 8 10*3/uL (ref 4.0–10.5)
nRBC: 0 % (ref 0.0–0.2)

## 2022-07-17 LAB — BASIC METABOLIC PANEL
Anion gap: 9 (ref 5–15)
BUN: 9 mg/dL (ref 6–20)
CO2: 24 mmol/L (ref 22–32)
Calcium: 9.4 mg/dL (ref 8.9–10.3)
Chloride: 107 mmol/L (ref 98–111)
Creatinine, Ser: 1.06 mg/dL (ref 0.61–1.24)
GFR, Estimated: >60 mL/min (ref >60)
Glucose, Bld: 96 mg/dL (ref 70–99)
Potassium: 4 mmol/L (ref 3.5–5.1)
Sodium: 140 mmol/L (ref 135–145)

## 2022-07-17 LAB — CBG MONITORING, ED: Glucose-Capillary: 109 mg/dL — ABNORMAL HIGH (ref 70–99)

## 2022-07-17 MED ORDER — MECLIZINE HCL 25 MG PO TABS: 25.0000 mg | ORAL_TABLET | Freq: Three times a day (TID) | ORAL | 0 refills | Status: AC | PRN

## 2022-07-17 MED ORDER — SODIUM CHLORIDE 0.9 % IV BOLUS: 500.0000 mL | Freq: Once | INTRAVENOUS | Status: DC

## 2022-07-17 MED ORDER — SODIUM CHLORIDE 0.9 % IV BOLUS
1000.0000 mL | Freq: Once | INTRAVENOUS | Status: AC
  Administered 2022-07-17: 1000 mL via INTRAVENOUS

## 2022-07-17 MED ORDER — MECLIZINE HCL 25 MG PO TABS
25.0000 mg | ORAL_TABLET | Freq: Once | ORAL | Status: AC
  Administered 2022-07-17: 25 mg via ORAL
  Filled 2022-07-17: qty 1

## 2022-07-17 NOTE — Discharge Instructions (Signed)
Call the number attached to set up care with your primary care provider.  Note the work-up today was overall reassuring.  Given your significant response to both IV fluids as well as meclizine given while in the emergency department, we will continue meclizine to take as needed at home.  He has been sent into your local pharmacy.  Continue drinking adequate liquids such as water, Gatorade, body armor that are low in sugar.  I recommend follow-up with a primary care provider within 1 week for reevaluation of your symptoms.  Please do not hesitate to return to the emergency department if the worrisome signs and symptoms we discussed become apparent.

## 2022-07-17 NOTE — ED Notes (Signed)
Pt ambulated to the bathroom and back to the bed without assistance

## 2022-07-17 NOTE — ED Provider Triage Note (Signed)
Emergency Medicine Provider Triage Evaluation Note  Tanner Sharp , a 26 y.o. male  was evaluated in triage.  Pt complains of dizziness starting at 11 am. Hx of similar, normally only lasts several minutes but this has lasted longer. Tried sleeping which normally helps it but hasn't helped. Takes sudafed regularly each week. 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   Review of Systems  Positive: Dizziness, headache,. R ear pressure Negative: Blurry vision, syncope, nausea  Physical Exam  BP 130/82 (BP Location: Right Arm)   Pulse 71   Temp 98.7 F (37.1 C) (Oral)   Resp 17   Ht 5\' 4"  (1.626 m)   Wt 86.2 kg   SpO2 96%   BMI 32.61 kg/m  Gen:   Awake, no distress   Resp:  Normal effort  MSK:   Moves extremities without difficulty  Other:    Medical Decision Making  Medically screening exam initiated at 5:34 PM.  Appropriate orders placed.  Ayham Word was informed that the remainder of the evaluation will be completed by another provider, this initial triage assessment does not replace that evaluation, and the importance of remaining in the ED until their evaluation is complete.     Norm Salt, PA-C 07/17/22 1736

## 2022-07-17 NOTE — ED Notes (Signed)
Pt offered wheelchair for exit by [employer], pt declined x2

## 2022-07-17 NOTE — ED Triage Notes (Signed)
Reports dizziness and intermittent headache since 1130 this am.  Has had this before but usually takes it easy and  it gets better but today it did not.  Complains of right ear pressure.

## 2022-07-17 NOTE — ED Provider Notes (Signed)
[facility] EMERGENCY DEPARTMENT Provider Note   CSN: [id number] Arrival date & time: 07/17/22  1634     History  Chief Complaint  Patient presents with   Dizziness    Eris Breck is a 26 y.o. male.   Dizziness   26 year old male presents emergency department with complaints of dizziness.  Patient states symptoms again approximately around 11 AM this morning when he was at work.  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  He denies loss of consciousness but does state that he has had similar symptoms in the past.  He states that symptoms usually occur with positional changes when he gets up from a seated position and only last a matter of a few to several minutes.  He states that he can usually sleep and symptoms resolve but today they persisted prompting his visit to the emergency department.  He states that he has only had 2 to 3 cups of water after symptoms started and only ate a small bowl of cereal earlier this morning before work.  He denies blurry vision, facial droop, slurred speech, weakness/sensory deficits, abnormalities in walking.  He notes some accompanying headache that is described as frontal in nature without radiation.  He also states he has been taking Sudafed as well as antihistamine 3-5 times a week regularly for "allergy symptoms."  Denies fever, chills, night sweats, chest pain, shortness of breath, abdominal pain, nausea, vomiting.  No significant family history of arrhythmias or sudden cardiac death.  Past medical history significant for ADHD, allergy, anxiety, depression, social anxiety disorder, anemia  Home Medications Prior to Admission medications   Medication Sig Start Date End Date Taking? Authorizing Provider  meclizine (ANTIVERT) 25 MG tablet Take 1 tablet (25 mg total) by mouth 3 (three) times daily as needed for dizziness. 07/17/22  Yes  Sherian Maroon A, PA  acetaminophen (TYLENOL) 325 MG tablet Take 650 mg by mouth every 6 (six) hours as needed for moderate pain (back pain).    [provider]  cyclobenzaprine (FLEXERIL) 10 MG tablet Take 1 tablet (10 mg total) by mouth 2 (two) times daily as needed for muscle spasms. 08/19/20   Moshe Cipro, NP  diphenhydrAMINE HCl (BENADRYL PO) Take by mouth.    [provider]  Ferrous Sulfate (IRON PO) Take by mouth.    [provider]  fexofenadine (ALLEGRA) 180 MG tablet Take 180 mg by mouth daily as needed for allergies.     [provider]  fluticasone (FLONASE) 50 MCG/ACT nasal spray Place into both nostrils daily.    [provider]  GABAPENTIN PO Take by mouth.    [provider]  guaiFENesin (MUCINEX PO) Take by mouth.    [provider]  ibuprofen (ADVIL,MOTRIN) 200 MG tablet Take 800 mg by mouth every 6 (six) hours as needed for fever or moderate pain.     [provider]  ondansetron (ZOFRAN) 4 MG tablet Take 1 tablet (4 mg total) by mouth every 8 (eight) hours as needed for nausea or vomiting. Patient not taking: Reported on 11/19/2018 04/30/18   Arthor Captain, PA-C  tamsulosin (FLOMAX) 0.4 MG CAPS capsule Take 1 capsule (0.4 mg total) by mouth daily. 08/19/20   Moshe Cipro, NP  traMADol (ULTRAM) 50 MG tablet Take 1 tablet (50 mg total) by mouth every 6 (six) hours as needed. 08/19/20   Ashley Royalty,  Judeth Cornfield, NP  Triamcinolone Acetonide (NASACORT AQ NA) Place into the nose.    [provider]      Allergies    Bee pollen, Pollen extract, and Other    Review of Systems   Review of Systems  Neurological:  Positive for dizziness.  All other systems reviewed and are negative.   Physical Exam Updated Vital Signs BP 116/74 (BP Location: Right Arm)   Pulse (!) 56   Temp 97.9 F (36.6 C) (Oral)   Resp 16   Ht 5\' 4"  (1.626 m)   Wt 86.2 kg   SpO2 98%   BMI 32.61 kg/m  Physical  Exam Vitals and nursing note reviewed.  Constitutional:      General: He is not in acute distress.    Appearance: He is well-developed.  HENT:     Head: Normocephalic and atraumatic.     Right Ear: Tympanic membrane normal.     Left Ear: Tympanic membrane normal.     Nose: Nose normal.     Mouth/Throat:     Mouth: Mucous membranes are moist.     Pharynx: Oropharynx is clear.  Eyes:     Extraocular Movements: Extraocular movements intact.     Conjunctiva/sclera: Conjunctivae normal.     Pupils: Pupils are equal, round, and reactive to light.  Cardiovascular:     Rate and Rhythm: Normal rate and regular rhythm.     Heart sounds: No murmur heard. Pulmonary:     Effort: Pulmonary effort is normal. No respiratory distress.     Breath sounds: Normal breath sounds.  Abdominal:     Palpations: Abdomen is soft.     Tenderness: There is no abdominal tenderness.  Musculoskeletal:        General: No swelling.     Cervical back: Neck supple.     Right lower leg: No edema.     Left lower leg: No edema.  Skin:    General: Skin is warm and dry.     Capillary Refill: Capillary refill takes less than 2 seconds.  Neurological:     Mental Status: He is alert.     Comments: Alert and oriented to self, place, time and event.   Speech is fluent, clear without dysarthria or dysphasia.   Strength 5/5 in upper/lower extremities   Sensation intact in upper/lower extremities   Normal gait.  Negative Romberg. No pronator drift.  Normal finger-to-nose and feet tapping.  CN I not tested  CN II grossly intact visual fields bilaterally. Did not visualize posterior eye.  CN III, IV, VI PERRLA and EOMs intact bilaterally  CN V Intact sensation to sharp and light touch to the face  CN VII facial movements symmetric  CN VIII not tested  CN IX, X no uvula deviation, symmetric rise of soft palate  CN XI 5/5 SCM and trapezius strength bilaterally  CN XII Midline tongue protrusion, symmetric L/R  movements   Hints exam without any abnormality.   Psychiatric:        Mood and Affect: Mood normal.     ED Results / Procedures / Treatments   Labs (all labs ordered are listed, but only abnormal results are displayed) Labs Reviewed  CBG MONITORING, ED - Abnormal; Notable for the following components:      Result Value   Glucose-Capillary 109 (*)    All other components within normal limits  CBC  BASIC METABOLIC PANEL    EKG None  Radiology No results found.  Procedures  Procedures    Medications Ordered in ED Medications  sodium chloride 0.9 % bolus 1,000 mL (1,000 mLs Intravenous Bolus 07/17/22 2035)  meclizine (ANTIVERT) tablet 25 mg (25 mg Oral Given 07/17/22 2034)    ED Course/ Medical Decision Making/ A&P                           Medical Decision Making  This patient presents to the ED for concern of dizziness, this involves an extensive number of treatment options, and is a complaint that carries with it a high risk of complications and morbidity.  The differential diagnosis includes vertigo, BPPV, malignancy, Mnire's disease, CVA, vertebral artery dissection, toxic ingestion, thyroid disease   Co morbidities that complicate the patient evaluation  See HPI   Additional history obtained:  Additional history obtained from EMR  Lab Tests:  I Ordered, and personally interpreted labs.  The pertinent results include: No leukocytosis noted.  No evidence of anemia.  Platelets within normal range.  No electrolyte abnormality.  Renal function within normal limits.  CBG 109.   Imaging Studies ordered:  N/a   Cardiac Monitoring: / EKG:  The patient was maintained on a cardiac monitor.  I personally viewed and interpreted the cardiac monitored which showed an underlying rhythm of: Sinus rhythm without ischemic changes.   Consultations Obtained:  N/a   Problem List / ED Course / Critical interventions / Medication management  Dizziness I ordered  medication including 1 L normal saline for rehydration as well as meclizine for dizziness   Reevaluation of the patient after these medicines showed that the patient resolved I have reviewed the patients home medicines and have made adjustments as needed   Social Determinants of Health:  Poor p.o. intake especially with fluids.   Test / Admission - Considered:  Vitals signs within normal range and stable throughout visit. Laboratory studies significant for: See above.  Imaging studies of patient's head considered but needed necessary due to peripheral type nature of patient's dizziness as well as prompt response to IV fluids as well as meclizine. Patient's vertigo type symptoms likely secondary to peripheral cause given acute onset, hints negative and resolution with IV fluids as well as meclizine.  Patient will prescribe meclizine outpatient as well as recommended adequate oral intake/avoid dehydration.  Close follow-up with primary care recommended in 2 to 3 days for reevaluation of patient's symptoms.  Treatment plan was discussed at length with patient he acknowledged understanding was agreeable to said plan. Worrisome signs and symptoms were discussed with the patient, and the patient acknowledged understanding to return to the ED if noticed. Patient was stable upon discharge.          Final Clinical Impression(s) / ED Diagnoses Final diagnoses:  Dizziness    Rx / DC Orders ED Discharge Orders          Ordered    meclizine (ANTIVERT) 25 MG tablet  3 times daily PRN        07/17/22 2230              Peter Garter, [place] 07/17/22 2231    Lonell Grandchild, MD 07/18/22 [phone]

## 2023-03-09 ENCOUNTER — Ambulatory Visit
Admission: EM | Admit: 2023-03-09 | Discharge: 2023-03-09 | Disposition: A | Discharge status: Home / Self Care | Payer: Medicaid Other | Attending: Emergency Medicine | Admitting: Emergency Medicine

## 2023-03-09 DIAGNOSIS — J302 Other seasonal allergic rhinitis: Secondary | ICD-10-CM | POA: Diagnosis not present

## 2023-03-09 MED ORDER — OLOPATADINE HCL 0.1 % OP SOLN: 1.0000 [drp] | Freq: Two times a day (BID) | OPHTHALMIC | 1 refills | Status: AC

## 2023-03-09 MED ORDER — PREDNISONE 10 MG (21) PO TBPK: ORAL_TABLET | ORAL | 0 refills | Status: DC

## 2023-03-09 MED ORDER — IPRATROPIUM BROMIDE 0.03 % NA SOLN: 2.0000 | Freq: Two times a day (BID) | NASAL | 1 refills | Status: AC

## 2023-03-09 NOTE — ED Provider Notes (Signed)
[facility]    CSN: [id number] Arrival date & time: 03/09/23  1313      History   Chief Complaint Chief Complaint  Patient presents with   Allergic Reaction    HPI Mikolaj Woolstenhulme is a 27 y.o. male. Pt has difficulty in the spring and fall with severe allergies.  He takes Allegra twice a day year-round.  When his allergies are very severe, he takes Benadryl, NyQuil, uses Nasacort or Flonase, takes a multi cold symptom combination that includes Sudafed, and takes guaifenesin.  He has been taking those meds for the last 2 days without relief. He is also been using some eyedrops that are anti-itch that his mom gave him. Nothing is relieving his symptoms.    Allergic Reaction   Past Medical History:  Diagnosis Date   ADHD    Allergy    Anemia    Anxiety    Depression    Eczema    Kidney stones    Lactose intolerance    Social anxiety disorder     Patient Active Problem List   Diagnosis Date Noted   Anemia 02/18/2012   ADHD (attention deficit hyperactivity disorder), combined type 02/18/2012    Past Surgical History:  Procedure Laterality Date   CYSTOSCOPY WITH STENT PLACEMENT Right 11/29/2018   Procedure: CYSTOSCOPY WITH STENT PLACEMENT;  Surgeon: Malen Gauze, MD;  Location: [facility];  Service: Urology;  Laterality: Right;   CYSTOSCOPY/RETROGRADE/URETEROSCOPY/STONE EXTRACTION WITH BASKET Right 11/29/2018   Procedure: CYSTOSCOPY/RETROGRADE/URETEROSCOPY/STONE EXTRACTION WITH BASKET;  Surgeon: Malen Gauze, MD;  Location: [facility];  Service: Urology;  Laterality: Right;  90 MINS   HOLMIUM LASER APPLICATION Right 11/29/2018   Procedure: HOLMIUM LASER APPLICATION;  Surgeon: Malen Gauze, MD;  Location: [facility];  Service: Urology;  Laterality: Right;       Home Medications    Prior to Admission medications   Medication Sig Start Date End Date Taking? Authorizing Provider  ipratropium (ATROVENT) 0.03 % nasal spray Place 2 sprays into both  nostrils every 12 (twelve) hours. 03/09/23  Yes Cathlyn Parsons, NP  olopatadine (PATANOL) 0.1 % ophthalmic solution Place 1 drop into both eyes 2 (two) times daily. 03/09/23  Yes Cathlyn Parsons, NP  predniSONE (STERAPRED UNI-PAK 21 TAB) 10 MG (21) TBPK tablet Take 6 tabs on day 1; take 5 tabs day 2; take 4 tabs day 3; take 3 tabs day 4; take 2 tabs day 5; take 1 tab day 6 03/09/23  Yes Jamiere Gulas, Marzella Schlein, NP  acetaminophen (TYLENOL) 325 MG tablet Take 650 mg by mouth every 6 (six) hours as needed for moderate pain (back pain).    [provider]  cyclobenzaprine (FLEXERIL) 10 MG tablet Take 1 tablet (10 mg total) by mouth 2 (two) times daily as needed for muscle spasms. 08/19/20   Moshe Cipro, NP  diphenhydrAMINE HCl (BENADRYL PO) Take by mouth.    [provider]  Ferrous Sulfate (IRON PO) Take by mouth.    [provider]  fexofenadine (ALLEGRA) 180 MG tablet Take 180 mg by mouth daily as needed for allergies.     [provider]  fluticasone (FLONASE) 50 MCG/ACT nasal spray Place into both nostrils daily.    [provider]  GABAPENTIN PO Take by mouth.    [provider]  guaiFENesin (MUCINEX PO) Take by mouth.    [provider]  ibuprofen (ADVIL,MOTRIN) 200 MG tablet Take 800 mg by mouth every 6 (six) hours as  needed for fever or moderate pain.     [provider]  meclizine (ANTIVERT) 25 MG tablet Take 1 tablet (25 mg total) by mouth 3 (three) times daily as needed for dizziness. 07/17/22   Sherian Maroon A, PA  ondansetron (ZOFRAN) 4 MG tablet Take 1 tablet (4 mg total) by mouth every 8 (eight) hours as needed for nausea or vomiting. Patient not taking: Reported on 11/19/2018 04/30/18   Arthor Captain, PA-C  tamsulosin (FLOMAX) 0.4 MG CAPS capsule Take 1 capsule (0.4 mg total) by mouth daily. 08/19/20   Moshe Cipro, NP  traMADol (ULTRAM) 50 MG tablet Take 1 tablet (50 mg total) by mouth every 6 (six) hours as  needed. 08/19/20   Moshe Cipro, NP  Triamcinolone Acetonide (NASACORT AQ NA) Place into the nose.    [provider]    Family History Family History  Problem Relation Age of Onset   Bipolar disorder Father     Social History Social History   Tobacco Use   Smoking status: Never   Smokeless tobacco: Never  Vaping Use   Vaping Use: Never used  Substance Use Topics   Alcohol use: Yes    Comment: occasionally   Drug use: Never     Allergies   Bee pollen, Pollen extract, and Other   Review of Systems Review of Systems   Physical Exam Triage Vital Signs ED Triage Vitals  Enc Vitals Group     BP 03/09/23 1336 123/80     Pulse Rate 03/09/23 1336 81     Resp 03/09/23 1336 17     Temp 03/09/23 1336 98 F (36.7 C)     Temp Source 03/09/23 1336 Oral     SpO2 03/09/23 1336 98 %     Weight --      Height --      Head Circumference --      Peak Flow --      Pain Score 03/09/23 1338 0     Pain Loc --      Pain Edu? --      Excl. in GC? --    No data found.  Updated Vital Signs BP 123/80 (BP Location: Left Arm)   Pulse 81   Temp 98 F (36.7 C) (Oral)   Resp 17   SpO2 98%   Visual Acuity Right Eye Distance:   Left Eye Distance:   Bilateral Distance:    Right Eye Near:   Left Eye Near:    Bilateral Near:     Physical Exam Constitutional:      General: He is not in acute distress.    Appearance: He is not ill-appearing.  HENT:     Right Ear: Tympanic membrane, ear canal and external ear normal.     Left Ear: Tympanic membrane, ear canal and external ear normal.     Nose: Congestion and rhinorrhea present.     Mouth/Throat:     Mouth: Mucous membranes are moist.     Pharynx: Oropharynx is clear. No oropharyngeal exudate or posterior oropharyngeal erythema.  Eyes:     Conjunctiva/sclera:     Right eye: Right conjunctiva is injected.     Left eye: Left conjunctiva is not injected.     Comments: R eye is watery, no purulent drainage from  either eye  Cardiovascular:     Rate and Rhythm: Normal rate and regular rhythm.  Pulmonary:     Effort: Pulmonary effort is normal.     Breath  sounds: Normal breath sounds.     Comments: Rare coughing Neurological:     Mental Status: He is alert.      UC Treatments / Results  Labs (all labs ordered are listed, but only abnormal results are displayed) Labs Reviewed - No data to display  EKG   Radiology No results found.  Procedures Procedures (including critical care time)  Medications Ordered in UC Medications - No data to display  Initial Impression / Assessment and Plan / UC Course  I have reviewed the triage vital signs and the nursing notes.  Pertinent labs & imaging results that were available during my care of the patient were reviewed by me and considered in my medical decision making (see chart for details).     Pt is already taking too much antihistamine. Symptoms are poorly controlled. He recently got Medicaid and has a new PCP appt on 03/12/23. He will discuss seeing allergist with new PCP.   For now, I rx olopatadine eye drops, ipratropium nasal spray, and prednisone. I have asked him to try and cut back on benadryl. Continue nasocort. Add neti pot saline irrigation.   Final Clinical Impressions(s) / UC Diagnoses   Final diagnoses:  Seasonal allergies     Discharge Instructions      Continue taking Allegra per package directions.   Don't take more than  50mg  of benadryl every 6 hours. I would like you to try and cut back on benadryl use if the medicines I prescribed today help.   Try using saline irrigation, such as with a neti pot, several times a day while you are having severe allergy symptoms. Many neti pots come with salt packets premeasured to use to make saline. If you use your own salt, make sure it is kosher salt or sea salt (don't use table salt as it has iodine in it and you don't need that in your nose). Use distilled water to make  saline. If you mix your own saline using your own salt, the recipe is 1/4 teaspoon salt in 1 cup warm water.   Don't use your mom's eye drops - use the ones I prescribed today.    ED Prescriptions     Medication Sig Dispense Auth. Provider   olopatadine (PATANOL) 0.1 % ophthalmic solution Place 1 drop into both eyes 2 (two) times daily. 5 mL Cathlyn Parsons, NP   ipratropium (ATROVENT) 0.03 % nasal spray Place 2 sprays into both nostrils every 12 (twelve) hours. 30 mL Cathlyn Parsons, NP   predniSONE (STERAPRED UNI-PAK 21 TAB) 10 MG (21) TBPK tablet Take 6 tabs on day 1; take 5 tabs day 2; take 4 tabs day 3; take 3 tabs day 4; take 2 tabs day 5; take 1 tab day 6 21 tablet Mashal Slavick, Marzella Schlein, NP      PDMP not reviewed this encounter.   Cathlyn Parsons, NP 03/09/23 1413

## 2023-03-09 NOTE — ED Triage Notes (Signed)
Pt presents with post nasal drainage, bilateral eye swelling, facial itchiness, facial swelling and redness X 2 days that has been unrelieved with antihistamine.

## 2023-03-09 NOTE — Discharge Instructions (Signed)
Continue taking Allegra per package directions.   Don't take more than   of benadryl every 6 hours. I would like you to try and cut back on benadryl use if the medicines I prescribed today help.   Try using saline irrigation, such as with a neti pot, several times a day while you are having severe allergy symptoms. Many neti pots come with salt packets premeasured to use to make saline. If you use your own salt, make sure it is kosher salt or sea salt (don't use table salt as it has iodine in it and you don't need that in your nose). Use distilled water to make saline. If you mix your own saline using your own salt, the recipe is 1/4 teaspoon salt in 1 cup warm water.   Don't use your mom's eye drops - use the ones I prescribed today.

## 2024-01-31 ENCOUNTER — Ambulatory Visit: Admission: EM | Admit: 2024-01-31 | Discharge: 2024-01-31 | Disposition: A | Discharge status: Home / Self Care

## 2024-01-31 DIAGNOSIS — J0100 Acute maxillary sinusitis, unspecified: Secondary | ICD-10-CM

## 2024-01-31 LAB — POCT INFLUENZA A/B
Influenza A, POC: NEGATIVE
Influenza B, POC: NEGATIVE

## 2024-01-31 MED ORDER — AZITHROMYCIN 250 MG PO TABS: ORAL_TABLET | ORAL | 0 refills | Status: AC

## 2024-01-31 MED ORDER — HYDROXYZINE HCL 25 MG PO TABS: 25.0000 mg | ORAL_TABLET | Freq: Four times a day (QID) | ORAL | 0 refills | Status: AC | PRN

## 2024-01-31 MED ORDER — PREDNISONE 20 MG PO TABS: 40.0000 mg | ORAL_TABLET | Freq: Every day | ORAL | 0 refills | Status: AC

## 2024-01-31 NOTE — Discharge Instructions (Addendum)
 Flu A and Flu B testing done today and are negative. Symptoms and physical exam findings are most consistent with a maxillary sinus infection. We will treat this with the following:  Azithromycin 250mg  Take 2 tablets today and the 1 tablet daily for 4 more days. Prednisone 40 mg (2 tablets) once daily for 5 days. Take this in the morning.  This is a steroid to help with inflammation and pain.  Hydroxyzine 25 mg every 6 hours as needed for allergy symptoms Tylenol for fevers, avoid motrin/ibuprofen while on the prednisone Rest and stay hydrated.  Return to urgent care or PCP if symptoms worsen or fail to resolve.

## 2024-01-31 NOTE — ED Provider Notes (Signed)
 [facility]    CSN: [id number] Arrival date & time: 01/31/24  1425      History   Chief Complaint Chief Complaint  Patient presents with   Cough   Sore Throat   Nasal Congestion   Generalized Body Aches    HPI Tanner Sharp is a 28 y.o. male.   28 year old male who presents to urgent care with 4 days of cough, congestion, fevers, chills, hoarseness.  He denies any known sick contacts but does work in [employer] ge[employer].  He reports that he has severe allergies and gets similar symptoms around this time of year.  He has not been taking anything that is not out of his normal which is Robitussin, Mucinex, Flonase alternating with Nasacort, Tylenol and numerous Benadryl daily.  He actually stopped taking the Flonase and Nasacort about 2 days ago because he ran out.  He denies any shortness of breath, chest pain, ear pain.  He has had some mild sore throat.  He has not had nausea, vomiting or abdominal pain.  He is eating and drinking well overall.   Cough Associated symptoms: chills, diaphoresis, headaches and rhinorrhea   Associated symptoms: no chest pain, no ear pain, no fever, no rash, no shortness of breath and no sore throat   Sore Throat Associated symptoms include headaches. Pertinent negatives include no chest pain, no abdominal pain and no shortness of breath.    Past Medical History:  Diagnosis Date   ADHD    Allergy    Anemia    Anxiety    Depression    Eczema    Kidney stones    Lactose intolerance    Social anxiety disorder     Patient Active Problem List   Diagnosis Date Noted   Anemia 02/18/2012   ADHD (attention deficit hyperactivity disorder), combined type 02/18/2012    Past Surgical History:  Procedure Laterality Date   CYSTOSCOPY WITH STENT PLACEMENT Right 11/29/2018   Procedure: CYSTOSCOPY WITH STENT PLACEMENT;  Surgeon: Malen Gauze, MD;  Location: [facility];  Service: Urology;  Laterality: Right;   CYSTOSCOPY/RETROGRADE/URETEROSCOPY/STONE  EXTRACTION WITH BASKET Right 11/29/2018   Procedure: CYSTOSCOPY/RETROGRADE/URETEROSCOPY/STONE EXTRACTION WITH BASKET;  Surgeon: Malen Gauze, MD;  Location: [facility];  Service: Urology;  Laterality: Right;  90 MINS   HOLMIUM LASER APPLICATION Right 11/29/2018   Procedure: HOLMIUM LASER APPLICATION;  Surgeon: Malen Gauze, MD;  Location: [facility];  Service: Urology;  Laterality: Right;       Home Medications    Prior to Admission medications   Medication Sig Start Date End Date Taking? Authorizing Provider  acetaminophen (TYLENOL) 325 MG tablet Take 650 mg by mouth every 6 (six) hours as needed for moderate pain (back pain).   Yes [provider]  diphenhydrAMINE HCl (BENADRYL PO) Take by mouth.   Yes [provider]  fexofenadine (ALLEGRA) 180 MG tablet Take 180 mg by mouth daily as needed for allergies.    Yes [provider]  fluticasone (FLONASE) 50 MCG/ACT nasal spray Place into both nostrils daily.   Yes [provider]  ibuprofen (ADVIL,MOTRIN) 200 MG tablet Take 800 mg by mouth every 6 (six) hours as needed for fever or moderate pain.    Yes [provider]  Pseudoephedrine HCl (SUDAFED PO) Take by mouth.   Yes [provider]  UNABLE TO FIND Med Name: Aspirin Powder.   Yes [provider]  cyclobenzaprine (FLEXERIL) 10 MG tablet Take 1 tablet (10 mg total) by mouth  2 (two) times daily as needed for muscle spasms. 08/19/20   Moshe Cipro, FNP  Ferrous Sulfate (IRON PO) Take by mouth.    [provider]  GABAPENTIN PO Take by mouth.    [provider]  guaiFENesin (MUCINEX PO) Take by mouth.    [provider]  ipratropium (ATROVENT) 0.03 % nasal spray Place 2 sprays into both nostrils every 12 (twelve) hours. 03/09/23   Cathlyn Parsons, NP  meclizine (ANTIVERT) 25 MG tablet Take 1 tablet (25 mg total) by mouth 3 (three) times daily as needed for dizziness. 07/17/22   Peter Garter, PA  olopatadine (PATANOL) 0.1 % ophthalmic solution Place 1 drop into both eyes 2 (two) times daily. 03/09/23   Cathlyn Parsons, NP  ondansetron (ZOFRAN) 4 MG tablet Take 1 tablet (4 mg total) by mouth every 8 (eight) hours as needed for nausea or vomiting. Patient not taking: Reported on 11/19/2018 04/30/18   Arthor Captain, PA-C  predniSONE (DELTASONE) 10 MG tablet Take 10 mg by mouth as directed. 07/17/23   [provider]  predniSONE (STERAPRED UNI-PAK 21 TAB) 10 MG (21) TBPK tablet Take 6 tabs on day 1; take 5 tabs day 2; take 4 tabs day 3; take 3 tabs day 4; take 2 tabs day 5; take 1 tab day 6 03/09/23   Cathlyn Parsons, NP  tamsulosin (FLOMAX) 0.4 MG CAPS capsule Take 1 capsule (0.4 mg total) by mouth daily. 08/19/20   Moshe Cipro, FNP  traMADol (ULTRAM) 50 MG tablet Take 1 tablet (50 mg total) by mouth every 6 (six) hours as needed. 08/19/20   Moshe Cipro, FNP  Triamcinolone Acetonide (NASACORT AQ NA) Place into the nose.    [provider]    Family History Family History  Problem Relation Age of Onset   Bipolar disorder Father     Social History Social History   Tobacco Use   Smoking status: Never   Smokeless tobacco: Never  Vaping Use   Vaping status: Never Used  Substance Use Topics   Alcohol use: Yes    Comment: once every 2 mos.   Drug use: Never     Allergies   Other, Peanut-containing drug products, Bee pollen, Pollen extract, Cat dander, and Grass pollen([id number] vern)   Review of Systems Review of Systems  Constitutional:  Positive for chills and diaphoresis. Negative for fever.  HENT:  Positive for rhinorrhea and voice change. Negative for ear pain and sore throat.   Eyes:  Negative for pain and visual disturbance.  Respiratory:  Positive for cough. Negative for shortness of breath.   Cardiovascular:  Negative for chest pain and palpitations.  Gastrointestinal:  Negative for abdominal pain and vomiting.  Genitourinary:   Negative for dysuria and hematuria.  Musculoskeletal:  Negative for arthralgias and back pain.       Body aches  Skin:  Negative for color change and rash.  Neurological:  Positive for headaches. Negative for seizures and syncope.  All other systems reviewed and are negative.    Physical Exam Triage Vital Signs ED Triage Vitals  Encounter Vitals Group     BP 01/31/24 1512 115/74     Systolic BP Percentile --      Diastolic BP Percentile --      Pulse Rate 01/31/24 1512 78     Resp 01/31/24 1512 20     Temp 01/31/24 1512 98.3 F (36.8 C)     Temp Source 01/31/24 1512 Oral  SpO2 01/31/24 1512 96 %     Weight 01/31/24 1507 190 lb 0.6 oz (86.2 kg)     Height 01/31/24 1507 5\' 4"  (1.626 m)     Head Circumference --      Peak Flow --      Pain Score 01/31/24 1503 4     Pain Loc --      Pain Education --      Exclude from Growth Chart --    No data found.  Updated Vital Signs BP 115/74 (BP Location: Right Arm)   Pulse 78   Temp 98.3 F (36.8 C) (Oral)   Resp 20   Ht 5\' 4"  (1.626 m)   Wt 190 lb 0.6 oz (86.2 kg)   SpO2 96%   BMI 32.62 kg/m   Visual Acuity Right Eye Distance:   Left Eye Distance:   Bilateral Distance:    Right Eye Near:   Left Eye Near:    Bilateral Near:     Physical Exam Vitals and nursing note reviewed.  Constitutional:      General: He is not in acute distress.    Appearance: He is well-developed.  HENT:     Head: Normocephalic and atraumatic.     Right Ear: Tympanic membrane normal.     Left Ear: Tympanic membrane normal.     Nose: Congestion and rhinorrhea present.     Mouth/Throat:     Mouth: Mucous membranes are moist.     Pharynx: Posterior oropharyngeal erythema (mild) present.  Eyes:     Conjunctiva/sclera: Conjunctivae normal.  Cardiovascular:     Rate and Rhythm: Normal rate and regular rhythm.     Heart sounds: No murmur heard. Pulmonary:     Effort: Pulmonary effort is normal. No respiratory distress.     Breath sounds:  Normal breath sounds.  Abdominal:     Palpations: Abdomen is soft.     Tenderness: There is no abdominal tenderness.  Musculoskeletal:        General: No swelling.     Cervical back: Neck supple.  Skin:    General: Skin is warm and dry.     Capillary Refill: Capillary refill takes less than 2 seconds.  Neurological:     Mental Status: He is alert.  Psychiatric:        Mood and Affect: Mood normal.     UC Treatments / Results  Labs (all labs ordered are listed, but only abnormal results are displayed) Labs Reviewed - No data to display  EKG   Radiology No results found.  Procedures Procedures (including critical care time)  Medications Ordered in UC Medications - No data to display  Initial Impression / Assessment and Plan / UC Course  I have reviewed the triage vital signs and the nursing notes.  Pertinent labs & imaging results that were available during my care of the patient were reviewed by me and considered in my medical decision making (see chart for details).     Acute non-recurrent maxillary sinusitis    Flu A and Flu B testing done today and are negative. Symptoms and physical exam findings are most consistent with a maxillary sinus infection. We will treat this with the following:  Azithromycin 250mg  Take 2 tablets today and the 1 tablet daily for 4 more days. Prednisone 40 mg (2 tablets) once daily for 5 days. Take this in the morning.  This is a steroid to help with inflammation and pain.  Hydroxyzine 25 mg every  6 hours as needed for allergy symptoms Tylenol for fevers, avoid motrin/ibuprofen while on the prednisone Rest and stay hydrated.   Final Clinical Impressions(s) / UC Diagnoses   Final diagnoses:  None   Discharge Instructions   None    ED Prescriptions   None    PDMP not reviewed this encounter.   Landis Martins, [place] 01/31/24 1639

## 2024-01-31 NOTE — ED Triage Notes (Signed)
"  This started out as runny nose, cough about 4 days ago since progressed with chills, body aches, persistent sweating, hoarse voice, headache, ? Sob". No history of Asthma. I had the seasonal COVID19 vaccine but not the Flu vaccine. "No fever known".

## 2024-11-13 ENCOUNTER — Ambulatory Visit (HOSPITAL_COMMUNITY): Admission: EM | Admit: 2024-11-13 | Discharge: 2024-11-13 | Disposition: A | Discharge status: Home / Self Care

## 2024-11-13 ENCOUNTER — Other Ambulatory Visit: Payer: Self-pay

## 2024-11-13 ENCOUNTER — Ambulatory Visit (HOSPITAL_COMMUNITY)

## 2024-11-13 ENCOUNTER — Encounter (HOSPITAL_COMMUNITY): Payer: Self-pay | Admitting: Emergency Medicine

## 2024-11-13 DIAGNOSIS — J029 Acute pharyngitis, unspecified: Secondary | ICD-10-CM

## 2024-11-13 DIAGNOSIS — R051 Acute cough: Secondary | ICD-10-CM

## 2024-11-13 LAB — POCT RAPID STREP A (OFFICE): Rapid Strep A Screen: NEGATIVE

## 2024-11-13 LAB — POC COVID19/FLU A&B COMBO
Covid Antigen, POC: NEGATIVE
Influenza A Antigen, POC: NEGATIVE
Influenza B Antigen, POC: NEGATIVE

## 2024-11-13 MED ORDER — IPRATROPIUM-ALBUTEROL 0.5-2.5 (3) MG/3ML IN SOLN
3.0000 mL | Freq: Once | RESPIRATORY_TRACT | Status: AC
  Administered 2024-11-13: 3 mL via RESPIRATORY_TRACT

## 2024-11-13 MED ORDER — ONDANSETRON 4 MG PO TBDP: 4.0000 mg | ORAL_TABLET | Freq: Three times a day (TID) | ORAL | 0 refills | Status: AC | PRN

## 2024-11-13 MED ORDER — IPRATROPIUM-ALBUTEROL 0.5-2.5 (3) MG/3ML IN SOLN
RESPIRATORY_TRACT | Status: AC
  Filled 2024-11-13: qty 3

## 2024-11-13 MED ORDER — PROMETHAZINE-DM 6.25-15 MG/5ML PO SYRP: 5.0000 mL | ORAL_SOLUTION | Freq: Every evening | ORAL | 0 refills | Status: AC | PRN

## 2024-11-13 MED ORDER — ALBUTEROL SULFATE HFA 108 (90 BASE) MCG/ACT IN AERS: 1.0000 | INHALATION_SPRAY | Freq: Four times a day (QID) | RESPIRATORY_TRACT | 0 refills | Status: AC | PRN

## 2024-11-13 NOTE — ED Triage Notes (Signed)
 Complains of cough, sinus pain and pressure and drainage for 4 days.  Phlegm is described as yellow.  Reports a temp of 102.6 on Friday   Has taken mucinex, robitussin, flonase , benadryl, ibuprofen  and tylenol 

## 2024-11-13 NOTE — Discharge Instructions (Addendum)
 You have a viral illness which will improve on its own with rest, fluids, and medications to help with your symptoms.  Use albuterol  inhaler 1-2 puffs every 6 hours as needed for shortness of breath, wheezing.  Take ondansetron  1 tablet every 8 hours as needed for nausea, vomiting.  Tylenol , guaifenesin (plain mucinex), and saline nasal sprays may help relieve symptoms.   Two teaspoons of honey in 1 cup of warm water every 4-6 hours may help with throat pains.  Humidifier in room at nighttime may help soothe cough (clean well daily).   Salt water gargles, 1/2-1 teaspoon salt dissolved in 1 cup warm water, gargle and spit out every 4-6 hours for sore throat.  Take Promethazine  DM cough medication to help with your cough at nighttime so that you are able to sleep. Do not drive, drink alcohol, or go to work while taking this medication since it can make you sleepy. Only take this at nighttime.   For chest pain, shortness of breath, inability to keep food or fluids down without vomiting, fever that does not respond to tylenol  or motrin , or any other severe symptoms, please go to the ER for further evaluation. Return to urgent care as needed, otherwise follow-up with PCP.

## 2024-11-13 NOTE — ED Provider Notes (Signed)
 " [facility]    CSN: [id number] Arrival date & time: 11/13/24  9070      History   Chief Complaint No chief complaint on file.   HPI Tanner Sharp is a 28 y.o. male.   This 28 year old male is being seen for complaints of fever, chills, headache, nasal congestion, sore throat, cough, nausea, vomiting, body aches ongoing for 4 days.  He reports last night his fever was 102.6.  He has been taking ibuprofen , Tylenol , Mucinex, Flonase with minimal improvement of symptoms.  He reports clear nasal drainage, and yellow phlegm with cough.  He endorses abdominal pain with coughing fits.  He reports poor appetite, fatigue, shortness of breath.  He denies dizziness, ear pain, chest pain, diarrhea.     Past Medical History:  Diagnosis Date   ADHD    Allergy    Anemia    Anxiety    Depression    Eczema    Kidney stones    Lactose intolerance    Social anxiety disorder     Patient Active Problem List   Diagnosis Date Noted   Anemia 02/18/2012   ADHD (attention deficit hyperactivity disorder), combined type 02/18/2012    Past Surgical History:  Procedure Laterality Date   CYSTOSCOPY WITH STENT PLACEMENT Right 11/29/2018   Procedure: CYSTOSCOPY WITH STENT PLACEMENT;  Surgeon: Sherrilee Belvie CROME, MD;  Location: [facility];  Service: Urology;  Laterality: Right;   CYSTOSCOPY/RETROGRADE/URETEROSCOPY/STONE EXTRACTION WITH BASKET Right 11/29/2018   Procedure: CYSTOSCOPY/RETROGRADE/URETEROSCOPY/STONE EXTRACTION WITH BASKET;  Surgeon: Sherrilee Belvie CROME, MD;  Location: [facility];  Service: Urology;  Laterality: Right;  90 MINS   HOLMIUM LASER APPLICATION Right 11/29/2018   Procedure: HOLMIUM LASER APPLICATION;  Surgeon: Sherrilee Belvie CROME, MD;  Location: [facility];  Service: Urology;  Laterality: Right;       Home Medications    Prior to Admission medications  Medication Sig Start Date End Date Taking? Authorizing Provider  acetaminophen  (TYLENOL ) 325 MG tablet Take 650 mg by mouth  every 6 (six) hours as needed for moderate pain (back pain).    [provider]  azithromycin  (ZITHROMAX ) 250 MG tablet Take first 2 tablets together, then 1 every day until finished. 01/31/24   White, Elizabeth A, PA-C  cyclobenzaprine  (FLEXERIL ) 10 MG tablet Take 1 tablet (10 mg total) by mouth 2 (two) times daily as needed for muscle spasms. 08/19/20   Alvia Corean CROME, FNP  diphenhydrAMINE HCl (BENADRYL PO) Take by mouth.    [provider]  Ferrous Sulfate (IRON PO) Take by mouth.    [provider]  fexofenadine (ALLEGRA) 180 MG tablet Take 180 mg by mouth daily as needed for allergies.     [provider]  fluticasone (FLONASE) 50 MCG/ACT nasal spray Place into both nostrils daily.    [provider]  GABAPENTIN  PO Take by mouth.    [provider]  guaiFENesin (MUCINEX PO) Take by mouth.    [provider]  hydrOXYzine  (ATARAX ) 25 MG tablet Take 1 tablet (25 mg total) by mouth every 6 (six) hours as needed. 01/31/24   White, Elizabeth A, PA-C  ibuprofen  (ADVIL ,MOTRIN ) 200 MG tablet Take 800 mg by mouth every 6 (six) hours as needed for fever or moderate pain.     [provider]  ipratropium (ATROVENT ) 0.03 % nasal spray Place 2 sprays into both nostrils every 12 (twelve) hours. 03/09/23   Richad Jon HERO, NP  meclizine  (ANTIVERT ) 25 MG tablet Take 1 tablet (25  mg total) by mouth 3 (three) times daily as needed for dizziness. 07/17/22   Silver Wonda LABOR, PA  olopatadine  (PATANOL) 0.1 % ophthalmic solution Place 1 drop into both eyes 2 (two) times daily. 03/09/23   Richad Jon HERO, NP  ondansetron  (ZOFRAN ) 4 MG tablet Take 1 tablet (4 mg total) by mouth every 8 (eight) hours as needed for nausea or vomiting. Patient not taking: Reported on 11/19/2018 04/30/18   Harris, Abigail, PA-C  Pseudoephedrine HCl (SUDAFED PO) Take by mouth.    [provider]  tamsulosin  (FLOMAX ) 0.4 MG CAPS capsule Take 1 capsule (0.4 mg  total) by mouth daily. 08/19/20   Alvia Corean CROME, FNP  traMADol  (ULTRAM ) 50 MG tablet Take 1 tablet (50 mg total) by mouth every 6 (six) hours as needed. 08/19/20   Alvia Corean CROME, FNP  Triamcinolone Acetonide (NASACORT AQ NA) Place into the nose.    [provider]  UNABLE TO FIND Med Name: Aspirin Powder.    [provider]    Family History Family History  Problem Relation Age of Onset   Bipolar disorder Father     Social History Social History[1]   Allergies   Other, Peanut-containing drug products, Bee pollen, Pollen extract, Cat dander, and Grass pollen(k-o-r-t-swt vern)   Review of Systems Review of Systems  Constitutional:  Positive for activity change, appetite change, chills, fatigue and fever.  HENT:  Positive for congestion, rhinorrhea and sore throat. Negative for ear pain.   Respiratory:  Positive for cough and shortness of breath.   Cardiovascular:  Negative for chest pain.  Gastrointestinal:  Positive for abdominal pain (With coughing), nausea and vomiting. Negative for diarrhea.  Musculoskeletal:  Positive for myalgias.  Skin:  Negative for color change and rash.  Neurological:  Positive for headaches. Negative for dizziness.     Physical Exam Triage Vital Signs ED Triage Vitals  Encounter Vitals Group     BP 11/13/24 1107 119/80     Girls Systolic BP Percentile --      Girls Diastolic BP Percentile --      Boys Systolic BP Percentile --      Boys Diastolic BP Percentile --      Pulse Rate 11/13/24 1107 92     Resp 11/13/24 1107 16     Temp 11/13/24 1107 98.6 F (37 C)     Temp Source 11/13/24 1107 Oral     SpO2 11/13/24 1107 96 %     Weight --      Height --      Head Circumference --      Peak Flow --      Pain Score 11/13/24 1103 3     Pain Loc --      Pain Education --      Exclude from Growth Chart --    No data found.  Updated Vital Signs BP 119/80 (BP Location: Left Arm)   Pulse 92   Temp 98.6 F (37  C) (Oral)   Resp 16   SpO2 96%   Visual Acuity Right Eye Distance:   Left Eye Distance:   Bilateral Distance:    Right Eye Near:   Left Eye Near:    Bilateral Near:     Physical Exam Vitals and nursing note reviewed.  Constitutional:      General: He is not in acute distress.    Appearance: He is well-developed. He is ill-appearing. He is not toxic-appearing.     Comments: Pleasant  male appearing stated age found sitting in chair in no acute distress.  HENT:     Head: Normocephalic and atraumatic.     Right Ear: Tympanic membrane and external ear normal.     Left Ear: Tympanic membrane and external ear normal.     Nose: Congestion and rhinorrhea present.     Mouth/Throat:     Lips: Pink.     Mouth: Mucous membranes are moist.     Pharynx: Posterior oropharyngeal erythema and postnasal drip present. No pharyngeal swelling or oropharyngeal exudate.  Eyes:     Conjunctiva/sclera: Conjunctivae normal.  Cardiovascular:     Rate and Rhythm: Normal rate and regular rhythm.     Heart sounds: Normal heart sounds. No murmur heard. Pulmonary:     Effort: Pulmonary effort is normal. No respiratory distress.     Breath sounds: Normal breath sounds.  Abdominal:     General: Bowel sounds are normal.     Palpations: Abdomen is soft.     Tenderness: There is no abdominal tenderness.  Musculoskeletal:     Cervical back: Neck supple.  Lymphadenopathy:     Cervical: Cervical adenopathy present.  Skin:    General: Skin is warm and dry.     Capillary Refill: Capillary refill takes less than 2 seconds.  Neurological:     Mental Status: He is alert.  Psychiatric:        Mood and Affect: Mood normal.      UC Treatments / Results  Labs (all labs ordered are listed, but only abnormal results are displayed) Labs Reviewed  POC COVID19/FLU A&B COMBO  POCT RAPID STREP A (OFFICE)    EKG   Radiology No results found.  Procedures Procedures (including critical care  time)  Medications Ordered in UC Medications  ipratropium-albuterol  (DUONEB) 0.5-2.5 (3) MG/3ML nebulizer solution 3 mL (has no administration in time range)    Initial Impression / Assessment and Plan / UC Course  I have reviewed the triage vital signs and the nursing notes.  Pertinent labs & imaging results that were available during my care of the patient were reviewed by me and considered in my medical decision making (see chart for details).     Vitals and triage reviewed, patient is hemodynamically stable.  COVID/flu and strep swab obtained, all are negative.  Chest x-ray negative.  Due to wheezing, he is given DuoNeb in clinic.  Presentation consistent with viral respiratory illness.  He is given prescription for promethazine -DM, albuterol  inhaler, ondansetron .  Advised supportive care with Tylenol  and/or ibuprofen , guaifenesin, saline nasal spray.  Advised honey water, salt water gargles, cool-mist humidifier.  Plan of care, follow-up care, return precautions given, no questions at this time.  He is given a work note. Final Clinical Impressions(s) / UC Diagnoses   Final diagnoses:  Acute cough  Sore throat   Discharge Instructions   None    ED Prescriptions   None    PDMP not reviewed this encounter.    [1]  Social History Tobacco Use   Smoking status: Never   Smokeless tobacco: Never  Vaping Use   Vaping status: Never Used  Substance Use Topics   Alcohol use: Yes    Comment: once every 2 mos.   Drug use: Never     Lennice Jon BROCKS, FNP 11/13/24 1305  "
